# Patient Record
Sex: Female | Born: 1980 | Race: White | Hispanic: No | Marital: Married | Smoking: Never smoker
Health system: Southern US, Community
[De-identification: ages and names within clinical notes are randomized; demographics above are authoritative.]

## PROBLEM LIST (undated history)

## (undated) DIAGNOSIS — Z9889 Other specified postprocedural states: Secondary | ICD-10-CM

## (undated) DIAGNOSIS — T783XXA Angioneurotic edema, initial encounter: Secondary | ICD-10-CM

## (undated) DIAGNOSIS — F419 Anxiety disorder, unspecified: Secondary | ICD-10-CM

## (undated) HISTORY — DX: Angioneurotic edema, initial encounter: T78.3XXA

## (undated) HISTORY — PX: APPENDECTOMY: SHX54

## (undated) HISTORY — DX: Anxiety disorder, unspecified: F41.9

---
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
## 2003-07-13 HISTORY — PX: BREAST SURGERY: SHX581

## 2006-07-12 HISTORY — PX: CERVICAL FUSION: SHX112

## 2008-07-12 HISTORY — PX: CERVICAL FUSION: SHX112

## 2017-03-18 ENCOUNTER — Ambulatory Visit (INDEPENDENT_AMBULATORY_CARE_PROVIDER_SITE_OTHER): Payer: BLUE CROSS/BLUE SHIELD | Admitting: Allergy

## 2017-03-18 ENCOUNTER — Encounter: Payer: Self-pay | Admitting: Allergy

## 2017-03-18 VITALS — BP 100/64 | HR 82 | Temp 98.3°F | Resp 14 | Ht 69.0 in | Wt 133.8 lb

## 2017-03-18 DIAGNOSIS — T783XXD Angioneurotic edema, subsequent encounter: Secondary | ICD-10-CM | POA: Diagnosis not present

## 2017-03-18 MED ORDER — EPINEPHRINE 0.3 MG/0.3ML IJ SOAJ: 0.3000 mg | Freq: Once | INTRAMUSCULAR | 2 refills | Status: AC

## 2017-03-18 NOTE — Patient Instructions (Addendum)
Lip swelling      - 3 episodes of lip swelling without an identifiable trigger.  Concerned for possible hereditary angioedema as lip swelling is not accompanied with hives or rash.  Also concerned possible alpha-gal as lip swelling occurred after hamburger ingestion.  Not concerned about the salmon, asparagus or potatoes as you have had these foods again on separate occasions without symptoms.     - will obtain the following labs: HAE, alpha-gal panel, tryptase, environmental panel.     - at this time recommend you take Loratidine 10mg  and Ranitidine (Zantac) 150mg  daily to help decrease change of swelling reoccurring.       - recommend you have access to an epinephrine device Audry Riles(AuviQ) in case you develop more serious reactions in future.  Follow emergency action plan.    Follow-up 6 months or sooner if needed

## 2017-03-18 NOTE — Progress Notes (Signed)
New Patient Note  RE: Marilyn Leonard MRN: [id number] DOB: April 20, 1981 Date of Office Visit: 03/18/2017  Referring provider: No ref. provider found Primary care provider: Patient, No Pcp Per  Chief Complaint: lip swelling  History of present illness: Marilyn Leonard is a 36 y.o. female presenting today for evaluation of lip swelling.    She states 10 years ago she noticed when she would take OTC decongestants she would have lip swelling. She thus avoids OTC decongestants.   She is most concerned as she has had 3 episodes of lip swelling this year.  She denies any other symptoms including hives/rash, GI, CV or respiratory symptoms.  She has not had any other body parts swell besides the lips.   She has tried to identify if there are any triggering events/foods but she has not been able to find any consistent trigger.  She states one episode of lip swelling she ate salmon, aparagus, potatoes for dinner.  She has since ate these foods on separate occasions without any symptoms.  Another episode she recalls having hamburger and she developed lip swelling later in the night.  [facility] benadryl, loratidine and prednisone.  This was about 1.5 weeks ago.  She reports with medication it took 5 days for the lip swelling to resolve. She has a brother who is allergic to PCN and reports he would get ear swelling but this is the only family member that has had swelling issues.   She reports since moving here her neighbor smokes cigars and her husband has been partaking in cigar smoking.  She is unsure if she is having issues related to the smoke.   She denies any tick bites.     She reports she has never had seasonal allergies or food allergy.    She moved here from [place] in April.     When she was 36yo she was stung by a stinging insect on leg and had swelling at this site.   Review of systems: Review of Systems  Constitutional: Negative for chills, fever and malaise/fatigue.    HENT: Negative for congestion, ear discharge, ear pain, nosebleeds, sinus pain, sore throat and tinnitus.   Eyes: Negative for pain, discharge and redness.  Respiratory: Negative for cough, shortness of breath and wheezing.   Cardiovascular: Negative for chest pain.  Gastrointestinal: Negative for abdominal pain, constipation, diarrhea, heartburn, nausea and vomiting.  Musculoskeletal: Negative for joint pain.  Skin: Negative for itching and rash.  Neurological: Negative for dizziness and headaches.    All other systems negative unless noted above in HPI  Past medical history: Past Medical History:  Diagnosis Date  . Angio-edema     Past surgical history: Past Surgical History:  Procedure Laterality Date  . CERVICAL FUSION  2008  . CERVICAL FUSION  2010    Family history:  Family History  Problem Relation Age of Onset  . Eczema Sister   . Food Allergy Sister   . Food Allergy Brother   . Allergic rhinitis Brother   . Angioedema Neg Hx   . Asthma Neg Hx   . Urticaria Neg Hx   . Immunodeficiency Neg Hx     Social history: She lives in a home with carpeting with gas heating and central cooling. There are no pets in the home. There is no concern for water damage, mildew or roaches in the home. She is a Futures traderhomemaker. She has no smoking history but is cigar smoke.  Medication List: Allergies as of 03/18/2017      Reactions   Decongestant [oxymetazoline] Swelling   Any decongestant cause swelling      Medication List       Accurate as of 03/18/17  4:51 PM. Always use your most recent med list.          EPINEPHrine 0.3 mg/0.3 mL Soaj injection Commonly known as:  AUVI-Q Inject 0.3 mLs (0.3 mg total) into the muscle once.            Discharge Care Instructions        Start     Ordered   03/18/17 0000  Hereditary Angioedema     03/18/17 1013   03/18/17 0000  Alpha-Gal Panel     03/18/17 1013   03/18/17 0000  Tryptase     03/18/17 1013   03/18/17 0000   Allergens, Zone 3     03/18/17 1013   03/18/17 0000  EPINEPHrine (AUVI-Q) 0.3 mg/0.3 mL IJ SOAJ injection   Once    Comments:  Phone: [phone]   03/18/17 1013      Known medication allergies: Allergies  Allergen Reactions  . Decongestant [Oxymetazoline] Swelling    Any decongestant cause swelling     Physical examination: Blood pressure 100/64, pulse 82, temperature 98.3 F (36.8 C), temperature source Oral, resp. rate 14, height  (1.753 m), weight 133 lb 12.8 oz (60.7 kg), SpO2 97 %.  General: Alert, interactive, in no acute distress. HEENT:PERRLA, TMs pearly gray, turbinates non-edematous without discharge, post-pharynx non erythematous. No facial edema Neck: Supple without lymphadenopathy. Lungs: Clear to auscultation without wheezing, rhonchi or rales. {no increased work of breathing. CV: Normal S1, S2 without murmurs. Abdomen: Nondistended, nontender. Skin: Warm and dry, without lesions or rashes. Extremities:  No clubbing, cyanosis or edema. Neuro:   Grossly intact.  Diagnositics/Labs:  Allergy testing: Skin prick testing to tobacco is negative. Allergy testing results were read and interpreted by provider, documented by clinical staff.   Assessment and plan:   Angioedema      - 3 episodes of lip swelling without an identifiable trigger.  Concerned for possible hereditary angioedema as lip swelling is not accompanied with hives or rash and takes several days to fully resolve (if this is HAE antihistamines and steroids are not effective).  Also concerned possible alpha-gal as lip swelling occurred after hamburger ingestion.  Not concerned about salmon, asparagus or potatoes as you have had these foods again on separate occasions without symptoms.     - will obtain the following labs: HAE, alpha-gal panel, tryptase, environmental panel.     - at this time recommend you take Loratidine  and Ranitidine (Zantac)  daily to help decrease change of swelling  reoccurring Until labs return    - recommend you have access to an epinephrine device Audry Riles) in case you develop more serious reactions in future.  Follow emergency action plan.    Follow-up 6 months or sooner if needed   I appreciate the opportunity to take part in Ghina's care. Please do not hesitate to contact me with questions.  Sincerely,   Margo Aye, MD Allergy/Immunology [facility]

## 2017-03-23 LAB — ALLERGENS, ZONE 3
Bermuda Grass IgE: <0.1 kU/L
Cockroach, American IgE: <0.1 kU/L
Common Silver Birch IgE: <0.1 kU/L
D Farinae IgE: <0.1 kU/L
D Pteronyssinus IgE: <0.1 kU/L
Dog Dander IgE: <0.1 kU/L
Maple/Box Elder IgE: <0.1 kU/L
Nettle IgE: <0.1 kU/L
Oak, White IgE: <0.1 kU/L

## 2017-03-23 LAB — ALPHA-GAL PANEL
Alpha Gal IgE*: <0.1 kU/L (ref <0.35)
Beef (Bos spp) IgE: <0.1 kU/L (ref <0.35)
Class Interpretation: 0
Class Interpretation: 0
LAMB CLASS INTERPRETATION: 0

## 2017-03-23 LAB — HAE INTERPRETATION:

## 2017-03-23 LAB — HEREDITARY ANGIOEDEMA
C1 ESTERASE INHIBITOR, SERUM: 25 mg/dL (ref 21–39)
Complement C4, Serum: 20 mg/dL (ref 14–44)

## 2017-03-23 LAB — TRYPTASE: Tryptase: 2.5 ug/L (ref 2.2–13.2)

## 2017-03-23 LAB — C1 ESTERASE INHIBITOR, FUNC: C1 EST.INHIB.FUNCT.: 82 %{normal}

## 2017-03-29 LAB — COMPLEMENT COMPONENT C1Q: COMPLEMENT C1Q: 14 mg/dL (ref 11.8–24.4)

## 2017-03-29 LAB — SPECIMEN STATUS REPORT

## 2019-08-13 ENCOUNTER — Other Ambulatory Visit: Payer: Self-pay | Admitting: Obstetrics and Gynecology

## 2019-08-13 DIAGNOSIS — N6320 Unspecified lump in the left breast, unspecified quadrant: Secondary | ICD-10-CM

## 2019-08-29 ENCOUNTER — Ambulatory Visit
Admission: RE | Admit: 2019-08-29 | Discharge: 2019-08-29 | Disposition: A | Discharge status: Home / Self Care | Payer: Self-pay | Source: Ambulatory Visit | Attending: Obstetrics and Gynecology | Admitting: Obstetrics and Gynecology

## 2019-08-29 ENCOUNTER — Other Ambulatory Visit: Payer: Self-pay

## 2019-08-29 ENCOUNTER — Ambulatory Visit
Admission: RE | Admit: 2019-08-29 | Discharge: 2019-08-29 | Disposition: A | Discharge status: Home / Self Care | Payer: BLUE CROSS/BLUE SHIELD | Source: Ambulatory Visit | Attending: Obstetrics and Gynecology | Admitting: Obstetrics and Gynecology

## 2019-08-29 ENCOUNTER — Other Ambulatory Visit: Payer: Self-pay | Admitting: Obstetrics and Gynecology

## 2019-08-29 ENCOUNTER — Ambulatory Visit
Admission: RE | Admit: 2019-08-29 | Discharge: 2019-08-29 | Disposition: A | Discharge status: Home / Self Care | Payer: PRIVATE HEALTH INSURANCE | Source: Ambulatory Visit | Attending: Obstetrics and Gynecology | Admitting: Obstetrics and Gynecology

## 2019-08-29 DIAGNOSIS — R928 Other abnormal and inconclusive findings on diagnostic imaging of breast: Secondary | ICD-10-CM

## 2019-08-29 DIAGNOSIS — N6320 Unspecified lump in the left breast, unspecified quadrant: Secondary | ICD-10-CM

## 2019-09-17 ENCOUNTER — Ambulatory Visit (INDEPENDENT_AMBULATORY_CARE_PROVIDER_SITE_OTHER): Payer: 59 | Admitting: Family Medicine

## 2019-09-17 ENCOUNTER — Other Ambulatory Visit: Payer: Self-pay

## 2019-09-17 ENCOUNTER — Encounter: Payer: Self-pay | Admitting: Family Medicine

## 2019-09-17 VITALS — BP 112/64 | HR 98 | Temp 98.5°F | Resp 14 | Ht 68.5 in | Wt 149.0 lb

## 2019-09-17 DIAGNOSIS — Z0000 Encounter for general adult medical examination without abnormal findings: Secondary | ICD-10-CM

## 2019-09-17 DIAGNOSIS — Z7689 Persons encountering health services in other specified circumstances: Secondary | ICD-10-CM

## 2019-09-17 DIAGNOSIS — F418 Other specified anxiety disorders: Secondary | ICD-10-CM

## 2019-09-17 DIAGNOSIS — Z13220 Encounter for screening for lipoid disorders: Secondary | ICD-10-CM | POA: Diagnosis not present

## 2019-09-17 MED ORDER — LORAZEPAM 0.5 MG PO TABS: 0.5000 mg | ORAL_TABLET | Freq: Two times a day (BID) | ORAL | 0 refills | Status: DC | PRN

## 2019-09-17 MED ORDER — ESCITALOPRAM OXALATE 10 MG PO TABS: 10.0000 mg | ORAL_TABLET | Freq: Every day | ORAL | 3 refills | Status: DC

## 2019-09-17 NOTE — Patient Instructions (Signed)
COVID Vaccination Information As of right now, we will not be giving COVID-19 vaccines here in our office. It is too many storage and administrating regulations that our office is not equipped to provide at this time.    You can go online at [email or web address]   That website will give you information on all counties.    If not here are the numbers you can call. [facility] - [phone] [facility] - [phone] or [phone] [place] - [phone] opt.2 [facility] - [phone]   You can also find information on [email or web address] or call the state's COVID-19 information phone number at 211.   

## 2019-09-17 NOTE — Progress Notes (Signed)
Subjective:    Patient ID: Marilyn Leonard, female    DOB: 13-Jun-1981, 39 y.o.   MRN: [id number]  HPI Patient is a very pleasant 39 year old Caucasian female here today to establish care.  Past medical history is significant for a neck injury that occurred in the early 2000's.  Shelving fell on her while she was at work injuring her neck.  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  She still has a third bulging disc that occasionally gives her numbness and weakness in her hands and occasional muscle spasms but she is elected to just live with it.  She was previously taking lorazepam for muscle spasms in her neck but since moving to [place] from [place] 2 years ago she has not required any medication.  Overall she is doing well.  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  Despite seeing an allergist, they were never able to determine the specific cause.  She has only had 2 episodes.  She treats this with antihistamines.  Family history is significant for a father with aortic stenosis status post aortic valve replacement, coronary artery disease, chronic kidney disease, and type II AV block.  Mom has a history of hypertension.  Mother also had breast cancer at age 8.  Patient is already had her mammogram this year which was normal.  She sees gynecology who performs her Pap smears.  She denies any specific medical concerns other than anxiety.  When she was in hospital she took Lexapro.  She was able to manage successfully.  However over the last year she has been under increasing stress.  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  Despite doing this she is also having to homeschool her children due to the COVID-19 pandemic.  Her husband is away from home quite often with work.  Therefore the majority of the responsibility falls on her shoulders.  As result she feels generally anxious and unable to relax most days.  She suffered  panic attacks in her 87s.  She denies any panic attacks now however she does report generalized anxiety that is difficult to control.  She denies any depression or suicidal ideation or insomnia. Past Medical History:  Diagnosis Date  . Angio-edema   . Anxiety    Past Surgical History:  Procedure Laterality Date  . APPENDECTOMY    . BREAST SURGERY  2005   L Breast Lumpectomy- benign  . CERVICAL FUSION  2008  . CERVICAL FUSION  2010  . ROTATOR CUFF REPAIR Right 1996   No current outpatient medications on file prior to visit.   No current facility-administered medications on file prior to visit.   Allergies  Allergen Reactions  . Decongestant [Oxymetazoline] Swelling    Any decongestant cause swelling   Social History   Socioeconomic History  . Marital status: Married    Spouse name: Not on file  . Number of children: 2  . Years of education: Not on file  . Highest education level: Not on file  Occupational History  . Occupation: Accounting  Tobacco Use  . Smoking status: Never Smoker  . Smokeless tobacco: Never Used  Substance and Sexual Activity  . Alcohol use: Not Currently  . Drug use: Never  . Sexual activity: Yes  Other Topics Concern  . Not on file  Social History Narrative  . Not on file   Social Determinants of Health  Financial Resource Strain:   . Difficulty of Paying Living Expenses: Not on file  Food Insecurity:   . Worried About Programme researcher, broadcasting/film/video in the Last Year: Not on file  . Ran Out of Food in the Last Year: Not on file  Transportation Needs:   . Lack of Transportation (Medical): Not on file  . Lack of Transportation (Non-Medical): Not on file  Physical Activity:   . Days of Exercise per Week: Not on file  . Minutes of Exercise per Session: Not on file  Stress:   . Feeling of Stress : Not on file  Social Connections:   . Frequency of Communication with Friends and Family: Not on file  . Frequency of Social Gatherings with Friends and  Family: Not on file  . Attends Religious Services: Not on file  . Active Member of Clubs or Organizations: Not on file  . Attends Banker Meetings: Not on file  . Marital Status: Not on file  Intimate Partner Violence:   . Fear of Current or Ex-Partner: Not on file  . Emotionally Abused: Not on file  . Physically Abused: Not on file  . Sexually Abused: Not on file   Family History  Problem Relation Age of Onset  . Eczema Sister   . Food Allergy Sister   . Food Allergy Brother   . Allergic rhinitis Brother   . Breast cancer Mother 55  . Heart disease Father   . Hypertension Father   . Kidney disease Father   . Angioedema Neg Hx   . Asthma Neg Hx   . Urticaria Neg Hx   . Immunodeficiency Neg Hx       Review of Systems  All other systems reviewed and are negative.      Objective:   Physical Exam Vitals reviewed.  Constitutional:      General: She is not in acute distress.    Appearance: Normal appearance. She is normal weight. She is not ill-appearing, toxic-appearing or diaphoretic.  HENT:     Head: Normocephalic and atraumatic.     Right Ear: Tympanic membrane, ear canal and external ear normal.     Left Ear: Tympanic membrane, ear canal and external ear normal.     Nose: Nose normal. No congestion or rhinorrhea.     Mouth/Throat:     Mouth: Mucous membranes are moist.     Pharynx: Oropharynx is clear. No oropharyngeal exudate or posterior oropharyngeal erythema.  Eyes:     General: No scleral icterus.       Right eye: No discharge.        Left eye: No discharge.     Extraocular Movements: Extraocular movements intact.     Conjunctiva/sclera: Conjunctivae normal.     Pupils: Pupils are equal, round, and reactive to light.  Neck:     Vascular: No carotid bruit.  Cardiovascular:     Rate and Rhythm: Normal rate and regular rhythm.     Pulses: Normal pulses.     Heart sounds: Normal heart sounds. No murmur. No friction rub. No gallop.   Pulmonary:      Effort: Pulmonary effort is normal. No respiratory distress.     Breath sounds: Normal breath sounds. No stridor. No wheezing, rhonchi or rales.  Chest:     Chest wall: No tenderness.  Abdominal:     General: Abdomen is flat. Bowel sounds are normal. There is no distension.     Palpations: Abdomen is soft. There is  no mass.     Tenderness: There is no abdominal tenderness. There is no right CVA tenderness, left CVA tenderness, guarding or rebound.     Hernia: No hernia is present.  Musculoskeletal:        General: No swelling, tenderness or deformity.     Cervical back: Normal range of motion and neck supple. No rigidity or tenderness.     Right lower leg: No edema.     Left lower leg: No edema.  Lymphadenopathy:     Cervical: No cervical adenopathy.  Skin:    General: Skin is warm.     Coloration: Skin is not jaundiced or pale.     Findings: No bruising, erythema, lesion or rash.  Neurological:     General: No focal deficit present.     Mental Status: She is alert and oriented to person, place, and time. Mental status is at baseline.     Cranial Nerves: No cranial nerve deficit.     Sensory: No sensory deficit.     Motor: No weakness.     Coordination: Coordination normal.     Gait: Gait normal.     Deep Tendon Reflexes: Reflexes normal.  Psychiatric:        Mood and Affect: Mood normal.        Behavior: Behavior normal.        Thought Content: Thought content normal.        Judgment: Judgment normal.           Assessment & Plan:  Establishing care with new doctor, encounter for - Plan: CBC with Differential/Platelet, COMPLETE METABOLIC PANEL WITH GFR, Lipid panel  Screening cholesterol level - Plan: CBC with Differential/Platelet, COMPLETE METABOLIC PANEL WITH GFR, Lipid panel  Situational anxiety  Patient's physical exam is completely normal.  I would like her to return fasting at her earliest convenience for CBC, CMP, and a fasting lipid panel.  We discussed  the flu shot today and the patient politely declined this.  She is due for Covid vaccination and I gave her the information to schedule for this at her earliest convenience.  Mammogram is up-to-date.  Pap smear is up-to-date.  She does not require colonoscopy until age 40.  She is interested in trying back Lexapro 10 mg a day for generalized anxiety disorder.  I will give her lorazepam that she can use sparingly for breakthrough anxiety.  She anticipates that she will use this very infrequently.  Reassess in 4 to 5 weeks to see if the Lexapro is helping and if so she can certainly continue that medication long-term for as long as she feels that she benefits from it.

## 2019-10-09 ENCOUNTER — Other Ambulatory Visit: Payer: Self-pay | Admitting: Family Medicine

## 2019-11-28 ENCOUNTER — Telehealth: Payer: Self-pay | Admitting: Family Medicine

## 2019-11-28 NOTE — Telephone Encounter (Signed)
Pt called after hours line  States she has congestion in her chest for 2 weeks now,  feels like previous bronchitis  has coughing fits when she eats so has not had anything really solid in past 2 days, but drinking fine  Occ wheeze Taking allergy meds, does not tolerate OTC cough.congestion meds Has history of bronchitis per pt   Had COVID 19 vaccines already No fever, chest pain, sinus drainage  She is okay with telehealth or in office visit for Thursday. Advised may need to see Crystal if you are full, she is okay with that

## 2019-11-29 ENCOUNTER — Ambulatory Visit (INDEPENDENT_AMBULATORY_CARE_PROVIDER_SITE_OTHER): Payer: 59 | Admitting: Family Medicine

## 2019-11-29 ENCOUNTER — Other Ambulatory Visit: Payer: Self-pay

## 2019-11-29 ENCOUNTER — Encounter: Payer: Self-pay | Admitting: Family Medicine

## 2019-11-29 ENCOUNTER — Ambulatory Visit
Admission: RE | Admit: 2019-11-29 | Discharge: 2019-11-29 | Disposition: A | Discharge status: Home / Self Care | Payer: 59 | Source: Ambulatory Visit | Attending: Family Medicine | Admitting: Family Medicine

## 2019-11-29 VITALS — BP 100/62 | HR 74 | Temp 97.7°F | Resp 14 | Ht 68.5 in | Wt 150.0 lb

## 2019-11-29 DIAGNOSIS — J40 Bronchitis, not specified as acute or chronic: Secondary | ICD-10-CM | POA: Diagnosis not present

## 2019-11-29 MED ORDER — PREDNISONE 20 MG PO TABS: ORAL_TABLET | ORAL | 0 refills | Status: DC

## 2019-11-29 MED ORDER — ALBUTEROL SULFATE HFA 108 (90 BASE) MCG/ACT IN AERS: 2.0000 | INHALATION_SPRAY | Freq: Four times a day (QID) | RESPIRATORY_TRACT | 1 refills | Status: DC | PRN

## 2019-11-29 MED ORDER — AZITHROMYCIN 250 MG PO TABS: ORAL_TABLET | ORAL | 0 refills | Status: DC

## 2019-11-29 NOTE — Progress Notes (Signed)
Subjective:    Patient ID: Marilyn Leonard, female    DOB: [birth year], 39 y.o.   MRN: [id number]  HPI  Patient is a very pleasant 39 year old Caucasian female who presents today with shortness of breath.  3 weeks ago, the patient had her first Pfizer Covid vaccine.  Shortly thereafter she developed body aches, fever, and a cough.  Cough is now productive with yellow and brown mucus.  She also reports wheezing and rhonchorous breath sounds.  She reports shortness of breath with minimal activity.  On examination today she has markedly diminished breath sounds in all 4 lung quadrants.  She has expiratory wheezing.  She has rhonchorous breath sounds.  She has bronchospasms and is also short of breath simply in talking. Past Medical History:  Diagnosis Date  . Angio-edema   . Anxiety    Past Surgical History:  Procedure Laterality Date  . APPENDECTOMY    . BREAST SURGERY  2005   L Breast Lumpectomy- benign  . CERVICAL FUSION  2008  . CERVICAL FUSION  2010  . ROTATOR CUFF REPAIR Right 1996   Current Outpatient Medications on File Prior to Visit  Medication Sig Dispense Refill  . escitalopram (LEXAPRO) 10 MG tablet TAKE 1 TABLET BY MOUTH EVERY DAY, INSURANCE NOT CONTRACTED WITH [facility] 90 tablet 2  . LORazepam (ATIVAN) 0.5 MG tablet Take 1 tablet (0.5 mg total) by mouth 2 (two) times daily as needed for anxiety. 30 tablet 0   No current facility-administered medications on file prior to visit.   Allergies  Allergen Reactions  . Decongestant [Oxymetazoline] Swelling    Any decongestant cause swelling   Social History   Socioeconomic History  . Marital status: Married    Spouse name: Not on file  . Number of children: 2  . Years of education: Not on file  . Highest education level: Not on file  Occupational History  . Occupation: Accounting  Tobacco Use  . Smoking status: Never Smoker  . Smokeless tobacco: Never Used  Substance and Sexual Activity  . Alcohol use: Not Currently  .  Drug use: Never  . Sexual activity: Yes  Other Topics Concern  . Not on file  Social History Narrative  . Not on file   Social Determinants of Health   Financial Resource Strain:   . Difficulty of Paying Living Expenses:   Food Insecurity:   . Worried About Programme researcher, broadcasting/film/video in the Last Year:   . Barista in the Last Year:   Transportation Needs:   . Freight forwarder (Medical):   Marland Kitchen Lack of Transportation (Non-Medical):   Physical Activity:   . Days of Exercise per Week:   . Minutes of Exercise per Session:   Stress:   . Feeling of Stress :   Social Connections:   . Frequency of Communication with Friends and Family:   . Frequency of Social Gatherings with Friends and Family:   . Attends Religious Services:   . Active Member of Clubs or Organizations:   . Attends Banker Meetings:   Marland Kitchen Marital Status:   Intimate Partner Violence:   . Fear of Current or Ex-Partner:   . Emotionally Abused:   Marland Kitchen Physically Abused:   . Sexually Abused:       Review of Systems  All other systems reviewed and are negative.      Objective:   Physical Exam Vitals reviewed.  Constitutional:      General: She is  not in acute distress.    Appearance: Normal appearance. She is not ill-appearing or toxic-appearing.  HENT:     Right Ear: Tympanic membrane and ear canal normal.     Left Ear: Tympanic membrane and ear canal normal.     Nose: Nose normal. No congestion or rhinorrhea.     Mouth/Throat:     Pharynx: No oropharyngeal exudate or posterior oropharyngeal erythema.  Cardiovascular:     Rate and Rhythm: Normal rate and regular rhythm.  Pulmonary:     Effort: No respiratory distress.     Breath sounds: Decreased air movement present. Wheezing and rhonchi present.  Neurological:     Mental Status: She is alert.           Assessment & Plan:  Bronchitis with wheezing - Plan: predniSONE (DELTASONE) 20 MG tablet, albuterol (VENTOLIN HFA) 108 (90 Base)  MCG/ACT inhaler, azithromycin (ZITHROMAX) 250 MG tablet, DG Chest 2 View  Patient was given 2.5 mg of albuterol in a nebulizer.  The wheezing did improve on her exam although she still seems very congested.  Also the albuterol made her very jittery and anxious.  She does have a history of panic attacks.  In the past she has tried albuterol and had similar reactions to it.  I did give her an albuterol inhaler.  I encouraged her to use 2 puffs every 6 hours as needed for wheezing and shortness of breath.  If it does make her feel anxious, she could take a half of an Ativan to help relax however she appears to be having bronchospasms and I believe would benefit from albuterol.  I will also start her on a prednisone taper pack given the level of wheezing and a decreased breath sounds throughout.  Begin a Z-Pak for bronchitis given the fact she is having purulent sputum that ha lasted now for 3 weeks.  Obtain a chest x-ray to rule out pneumonia.

## 2019-11-29 NOTE — Telephone Encounter (Signed)
I tried calling patient to see if she would like to schedule an appointment but mailbox is full.

## 2019-12-14 ENCOUNTER — Other Ambulatory Visit: Payer: Self-pay | Admitting: Family Medicine

## 2019-12-14 ENCOUNTER — Telehealth: Payer: Self-pay | Admitting: Family Medicine

## 2019-12-14 MED ORDER — HYDROCODONE-HOMATROPINE 5-1.5 MG/5ML PO SYRP: 5.0000 mL | ORAL_SOLUTION | Freq: Three times a day (TID) | ORAL | 0 refills | Status: DC | PRN

## 2019-12-14 NOTE — Telephone Encounter (Signed)
I will send hycodan for cough.  Letter is in Epic.

## 2019-12-14 NOTE — Telephone Encounter (Signed)
Patient called in stating that she has completed the Zpak, and Prednisone taper and she is still coughing. She would like to know if you could call in something for cough. Also she would like to know if you can write a note for her to take to her [facility] since she has not been able to go for the past 3 weeks due to cough so that she could get reimbursed for the missed time. Please advise?

## 2019-12-14 NOTE — Telephone Encounter (Signed)
Left message return call

## 2019-12-17 ENCOUNTER — Telehealth: Payer: Self-pay

## 2019-12-17 MED ORDER — GUAIFENESIN-CODEINE 100-10 MG/5ML PO SOLN: 5.0000 mL | Freq: Four times a day (QID) | ORAL | 0 refills | Status: DC | PRN

## 2019-12-17 NOTE — Telephone Encounter (Signed)
rOBITUSSIN CODIENE CALLED Pt seen for bronchitis

## 2019-12-17 NOTE — Telephone Encounter (Signed)
Pt has been called, to pick up gym note, not available to leave a voice mail, also she would like cough medicine.

## 2019-12-18 NOTE — Telephone Encounter (Signed)
Pt aware note is ready to be picked up

## 2020-06-24 ENCOUNTER — Other Ambulatory Visit: Payer: Self-pay

## 2020-06-24 MED ORDER — ESCITALOPRAM OXALATE 10 MG PO TABS: ORAL_TABLET | ORAL | 2 refills | Status: DC

## 2021-02-28 ENCOUNTER — Other Ambulatory Visit: Payer: Self-pay | Admitting: Family Medicine

## 2021-09-07 IMAGING — MG DIGITAL DIAGNOSTIC BILAT W/ TOMO W/ CAD
8 of 14 series · 8 of 40 positions shown · non-contrast
Comparison: None

CLINICAL DATA: Patient complains of focal left breast pain.
Patient's mother was diagnosed with breast cancer at the age of 45.

EXAM:
DIGITAL DIAGNOSTIC BILATERAL MAMMOGRAM WITH CAD AND TOMO
ULTRASOUND BILATERAL BREAST

[R CC synth-2D]
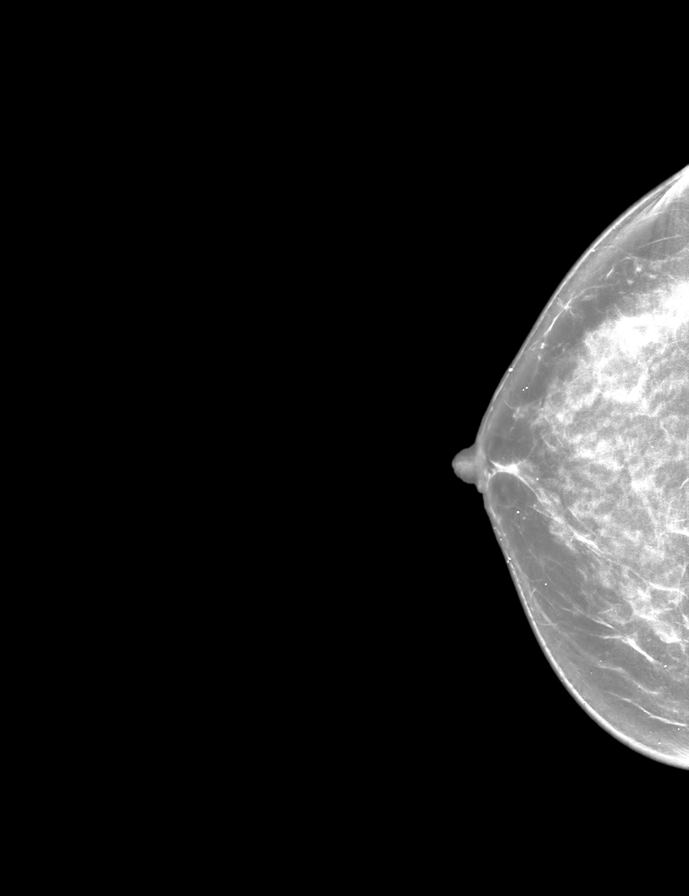

[L CC synth-2D]
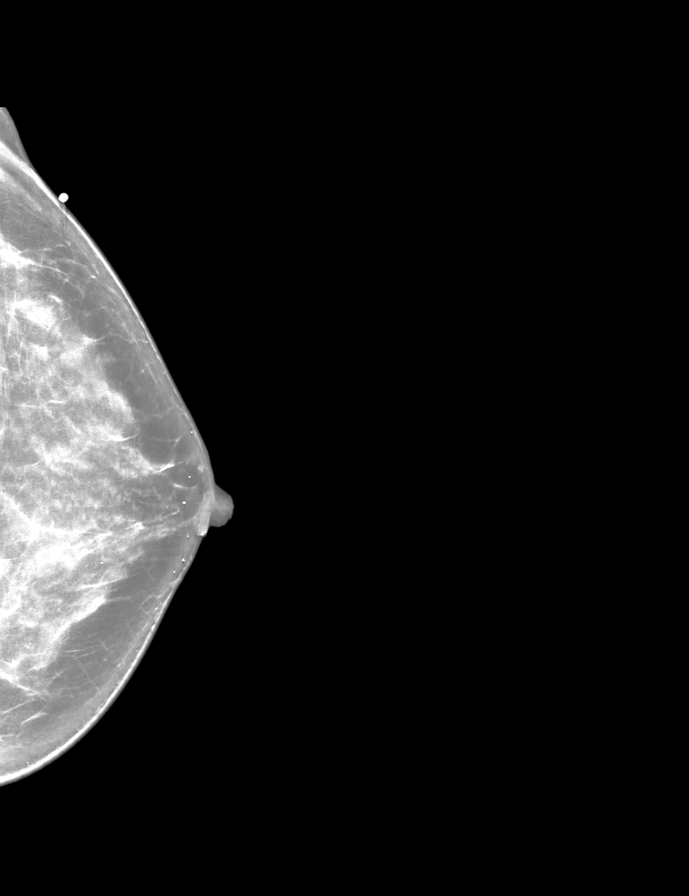

[R MLO synth-2D (1 of 2)]
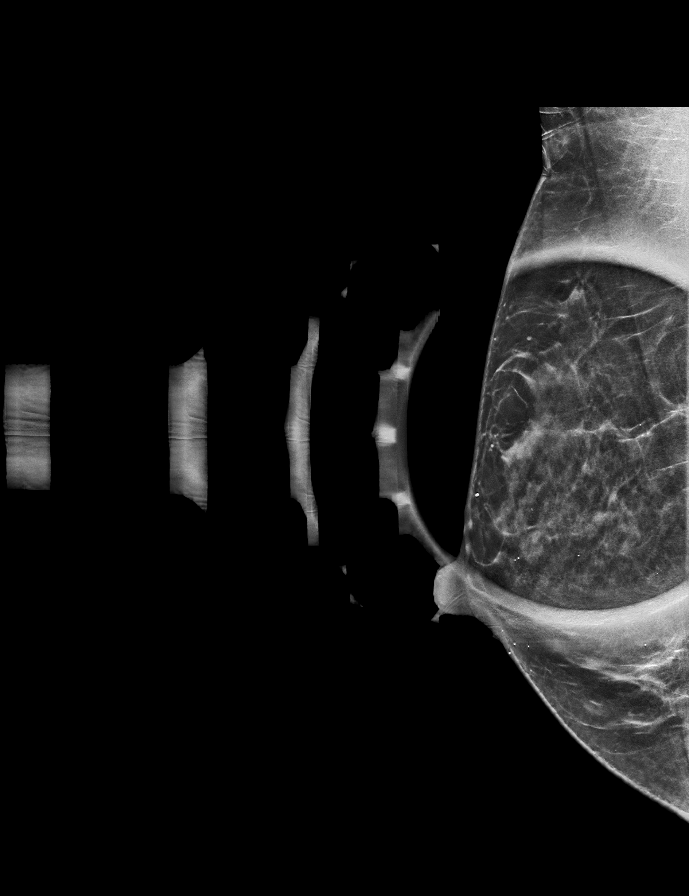

[L TAN synth-2D]
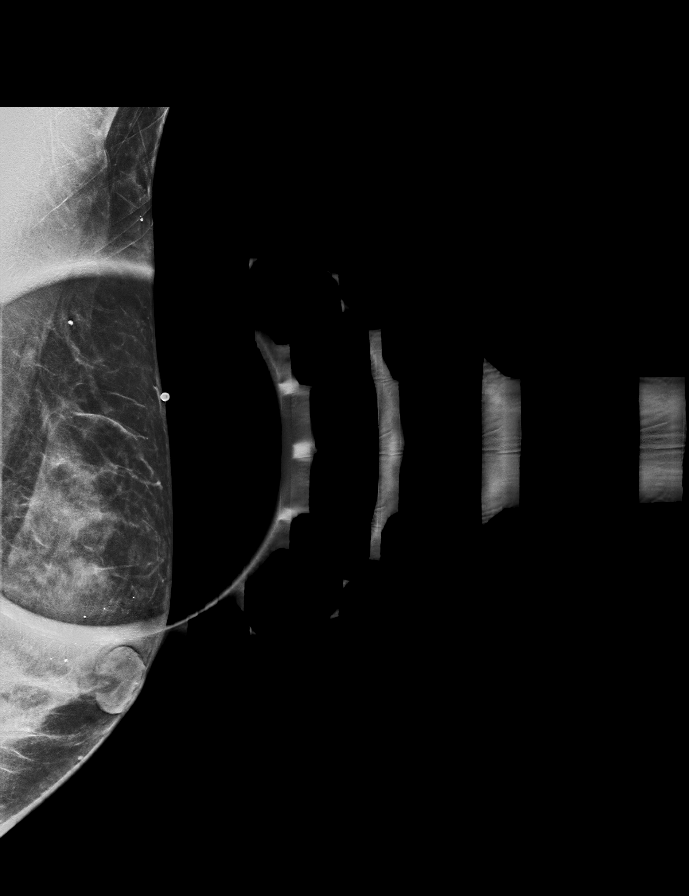

[R MLO synth-2D (2 of 2)]
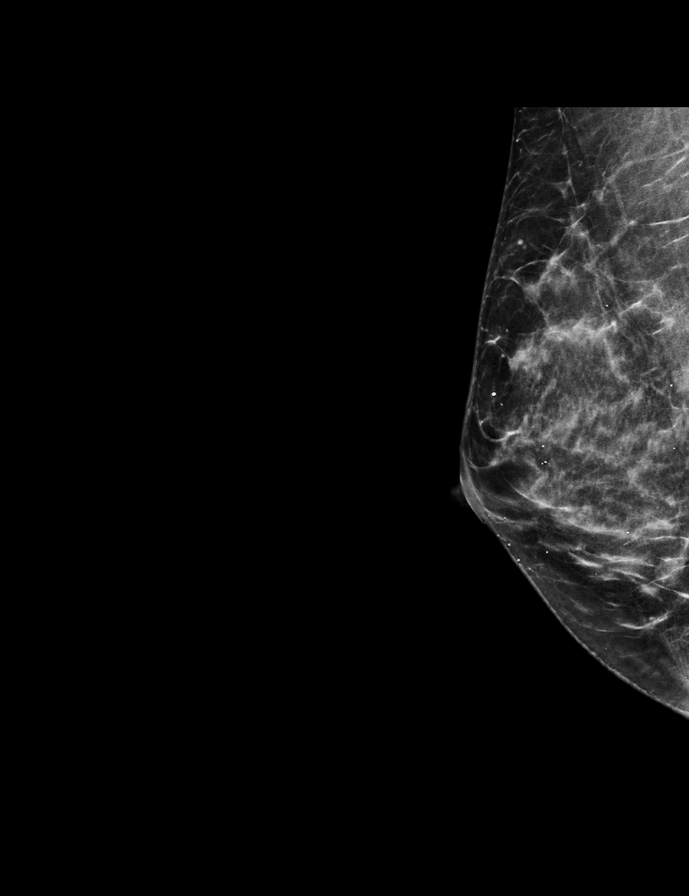

[R ML synth-2D]
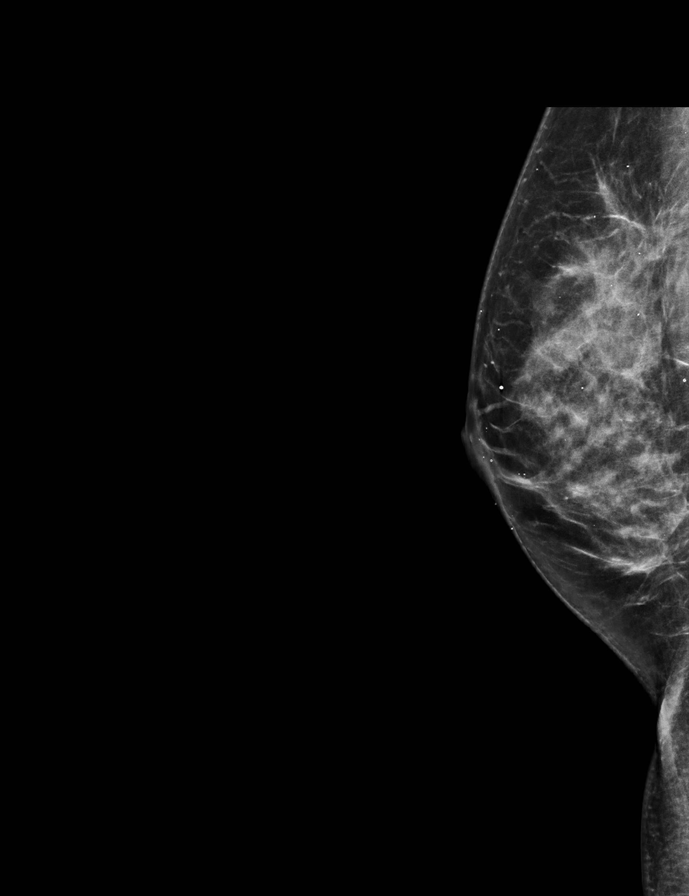

[L MLO synth-2D]
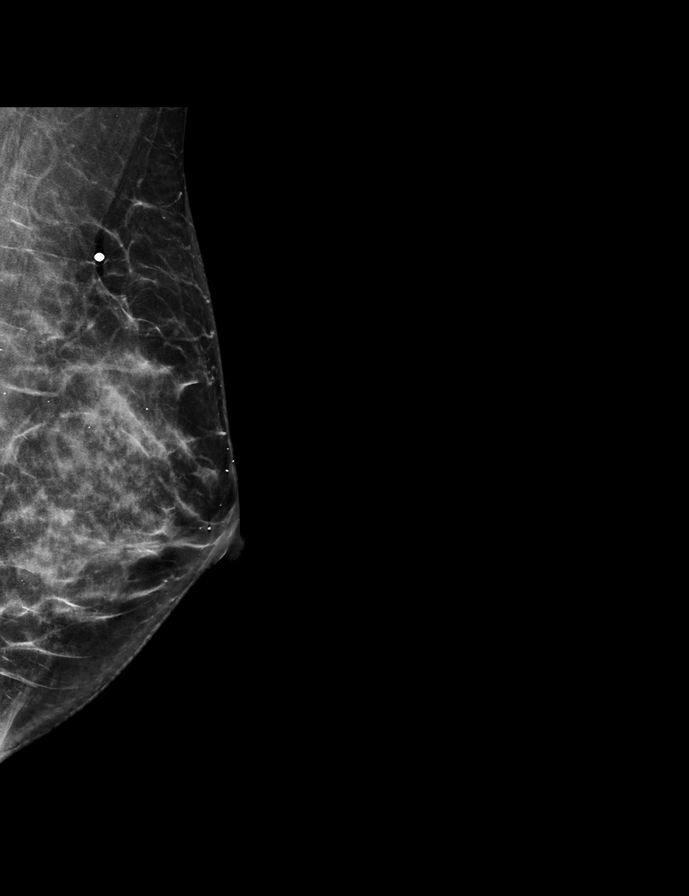

[L TAN tomo · tomo slice 27/54.0]
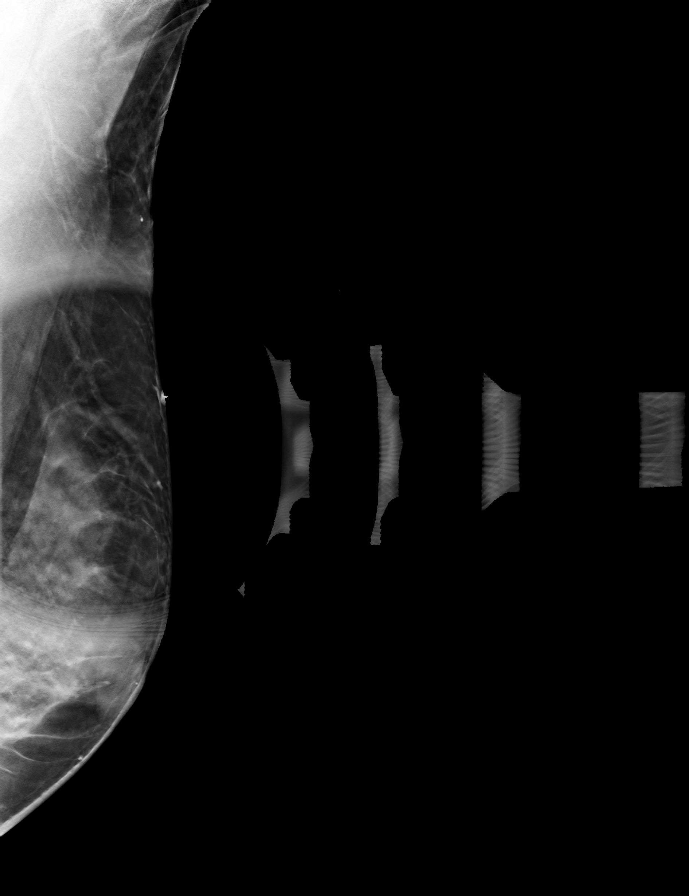

[8 of 40 positions shown; findings below may reference images not displayed]

ACR Breast Density Category c: The breast tissue is heterogeneously
dense, which may obscure small masses.
FINDINGS: Asymmetric fibroglandular tissue was seen in the superior aspect of
the right breast. Additional imaging through this area shows normal
fibroglandular tissue. No suspicious mass or malignant type
microcalcifications identified in either breast.

Mammographic images were processed with CAD.

Targeted ultrasound is performed, showing normal tissue in the
upper-outer quadrant of the right breast. Normal fibroglandular
tissue is seen in the left breast at 1 o'clock 7 cm from the nipple
with the patient complains of focal pain.
IMPRESSION: No evidence of malignancy in either breast.

RECOMMENDATION:
Bilateral screening mammogram in 1 year is recommended.

The patient's mother was diagnosed with breast cancer at the age of
45. The American Cancer Society recommends annual MRI and
mammography in patients with an estimated lifetime risk of
developing breast cancer greater than 20 - 25%, or who are known or
suspected to be positive for the breast cancer gene.

I have discussed the findings and recommendations with the patient.
If applicable, a reminder letter will be sent to the patient
regarding the next appointment.

BI-RADS CATEGORY  1: Negative.

## 2021-09-07 IMAGING — US US BREAST*R* LIMITED INC AXILLA
1 series · 3 of 3 positions shown · non-contrast
Comparison: None

CLINICAL DATA: Patient complains of focal left breast pain.
Patient's mother was diagnosed with breast cancer at the age of 45.

EXAM:
DIGITAL DIAGNOSTIC BILATERAL MAMMOGRAM WITH CAD AND TOMO
ULTRASOUND BILATERAL BREAST

[Series 1: us breast*right* limited inc axilla · 0.06mm/px · 3 of 3 slices shown]
[im 1/3]
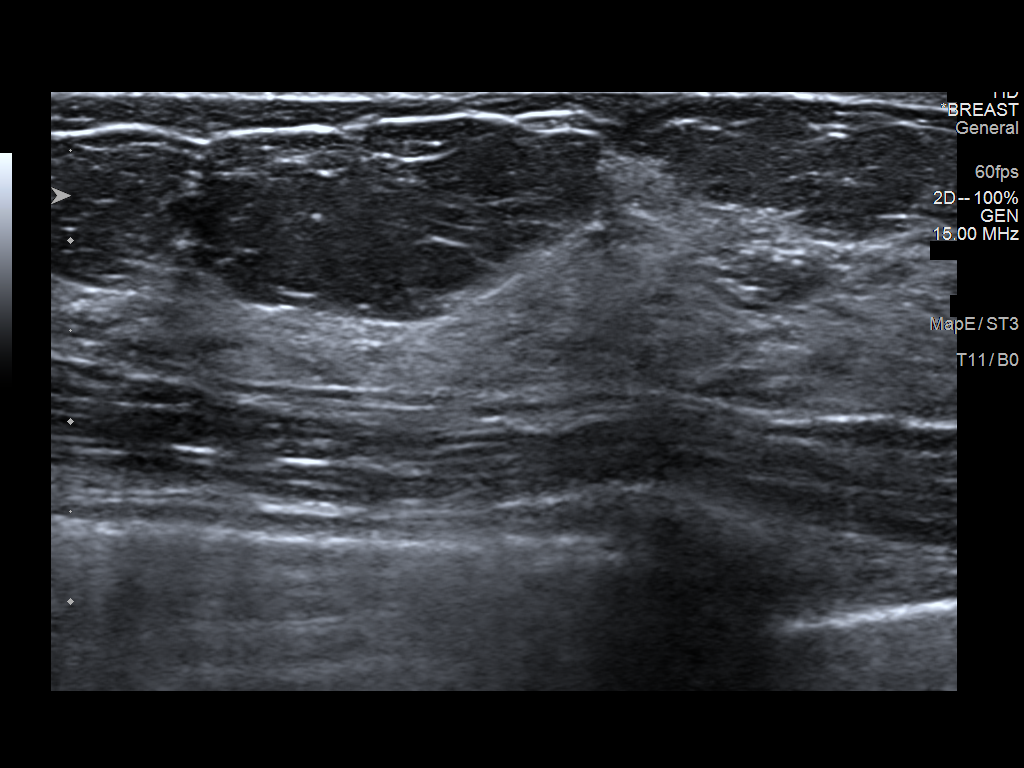
[im 2/3]
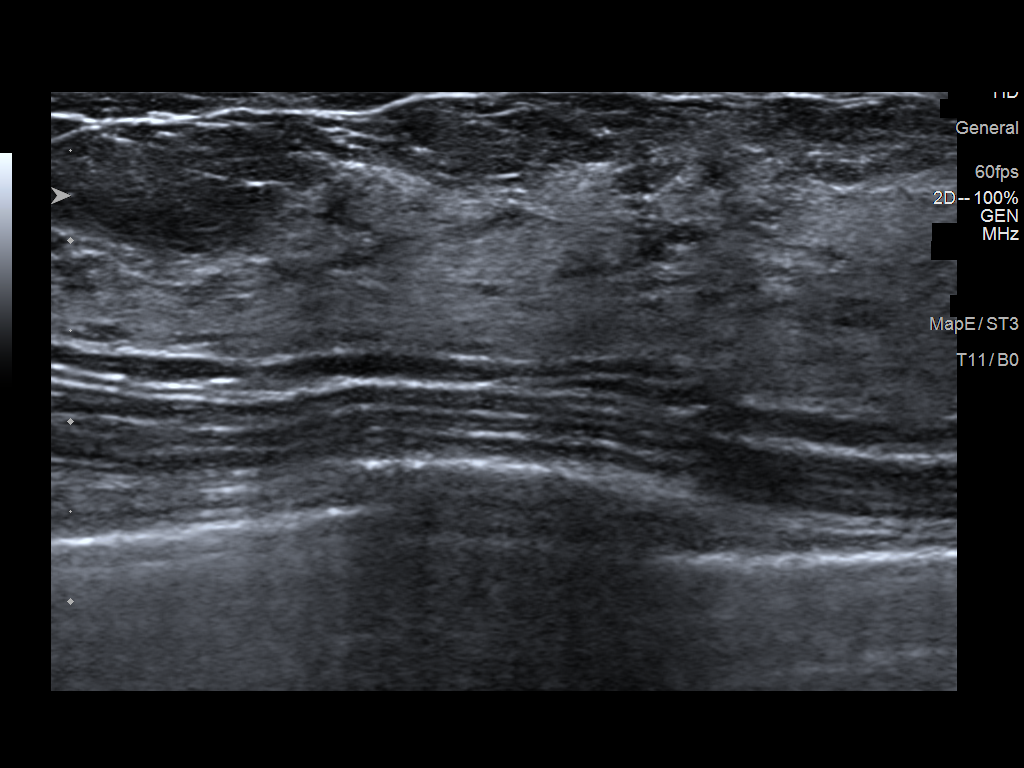
[im 3/3]
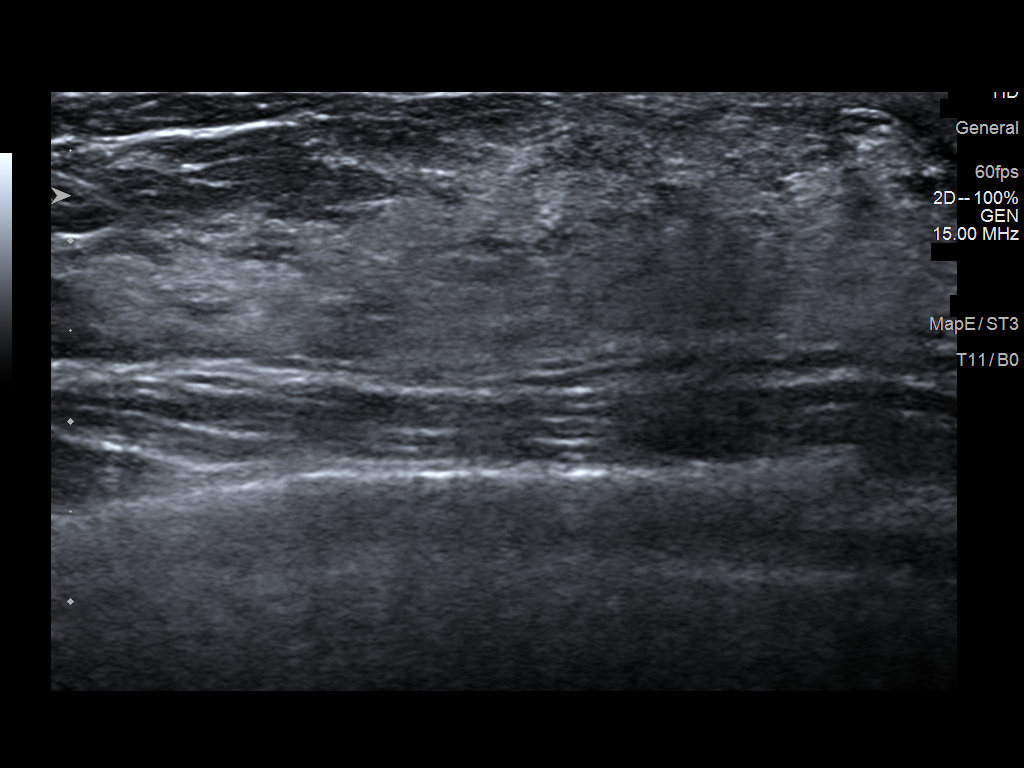

[3 of 3 positions shown; findings below may reference images not displayed]

ACR Breast Density Category c: The breast tissue is heterogeneously
dense, which may obscure small masses.
FINDINGS: Asymmetric fibroglandular tissue was seen in the superior aspect of
the right breast. Additional imaging through this area shows normal
fibroglandular tissue. No suspicious mass or malignant type
microcalcifications identified in either breast.

Mammographic images were processed with CAD.

Targeted ultrasound is performed, showing normal tissue in the
upper-outer quadrant of the right breast. Normal fibroglandular
tissue is seen in the left breast at 1 o'clock 7 cm from the nipple
with the patient complains of focal pain.
IMPRESSION: No evidence of malignancy in either breast.

RECOMMENDATION:
Bilateral screening mammogram in 1 year is recommended.

The patient's mother was diagnosed with breast cancer at the age of
45. The American Cancer Society recommends annual MRI and
mammography in patients with an estimated lifetime risk of
developing breast cancer greater than 20 - 25%, or who are known or
suspected to be positive for the breast cancer gene.

I have discussed the findings and recommendations with the patient.
If applicable, a reminder letter will be sent to the patient
regarding the next appointment.

BI-RADS CATEGORY  1: Negative.

## 2021-12-08 IMAGING — CR DG CHEST 2V
2 series · 2 of 2 positions shown · non-contrast
Comparison: None.

CLINICAL DATA: Wheezing

EXAM:
CHEST - 2 VIEW

[w chest pa]
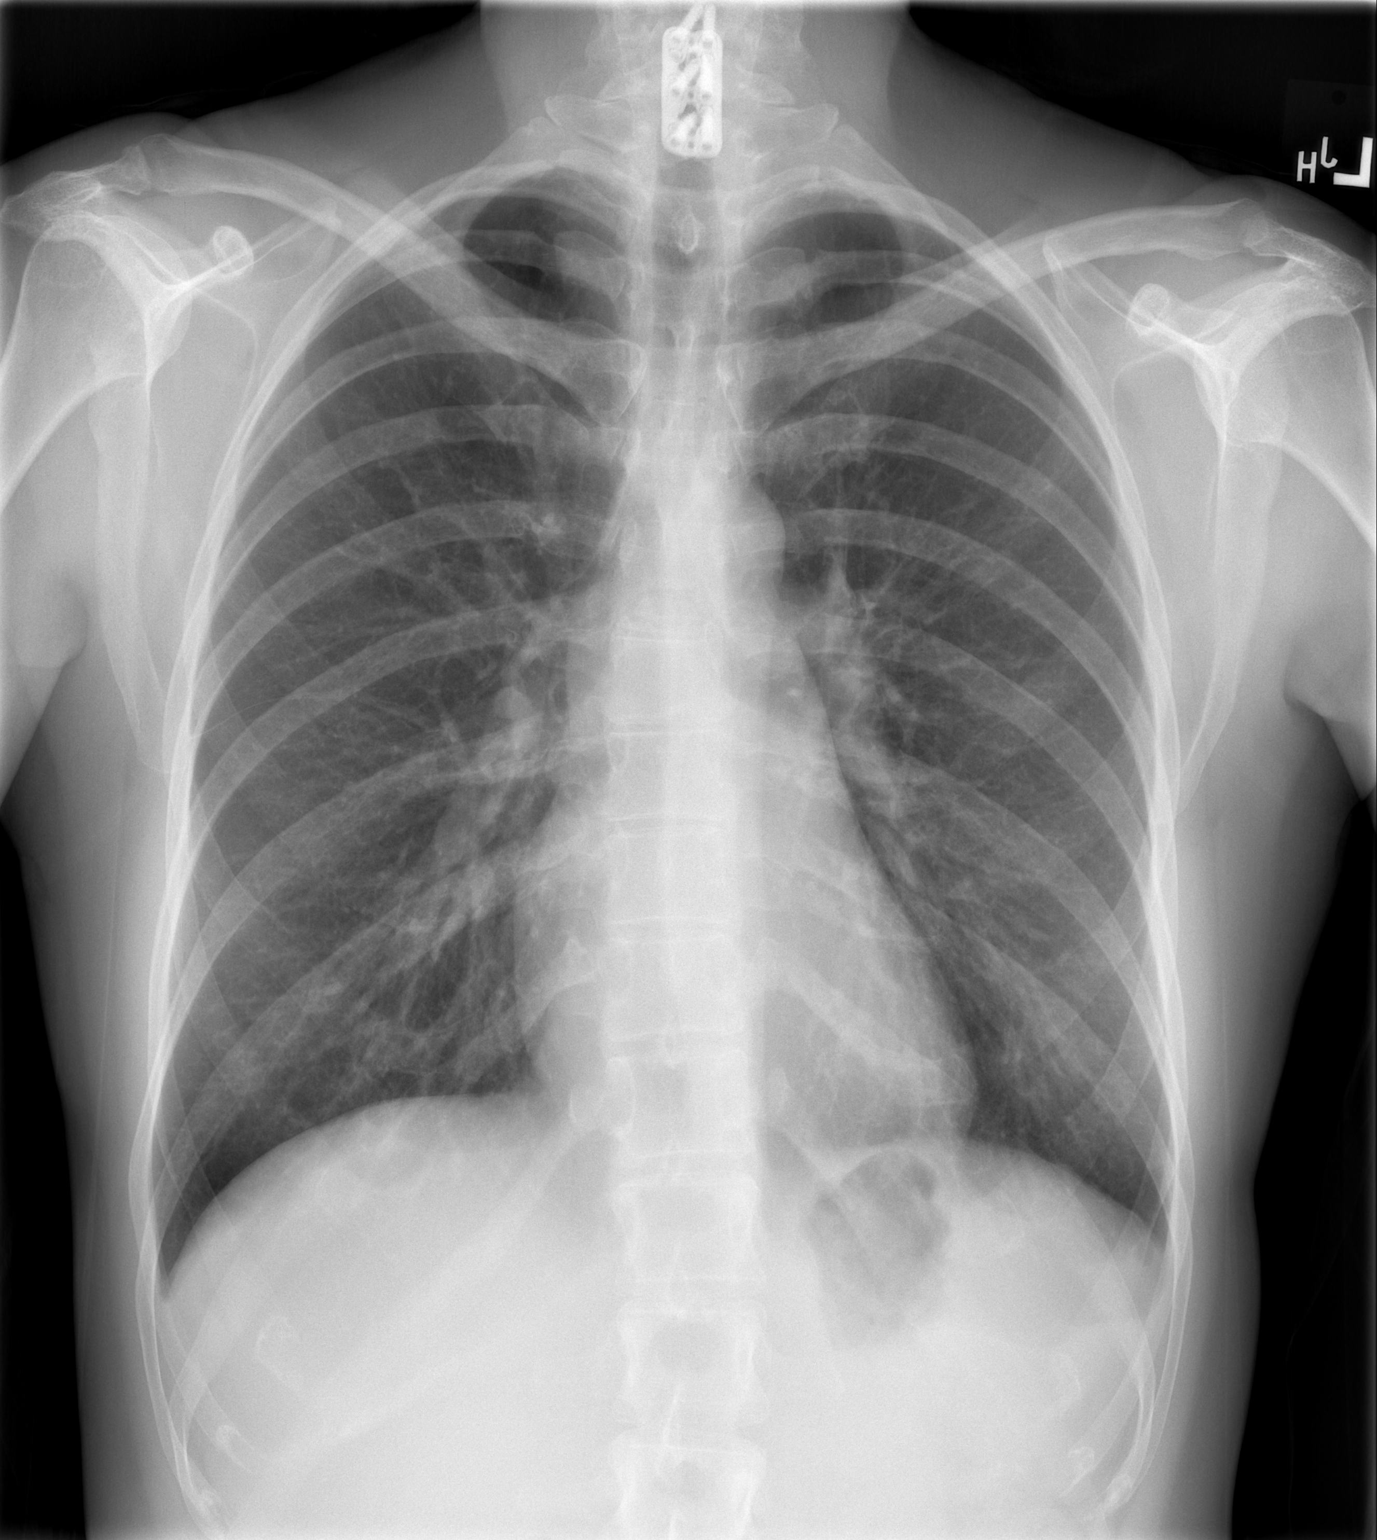

[w chest lat]
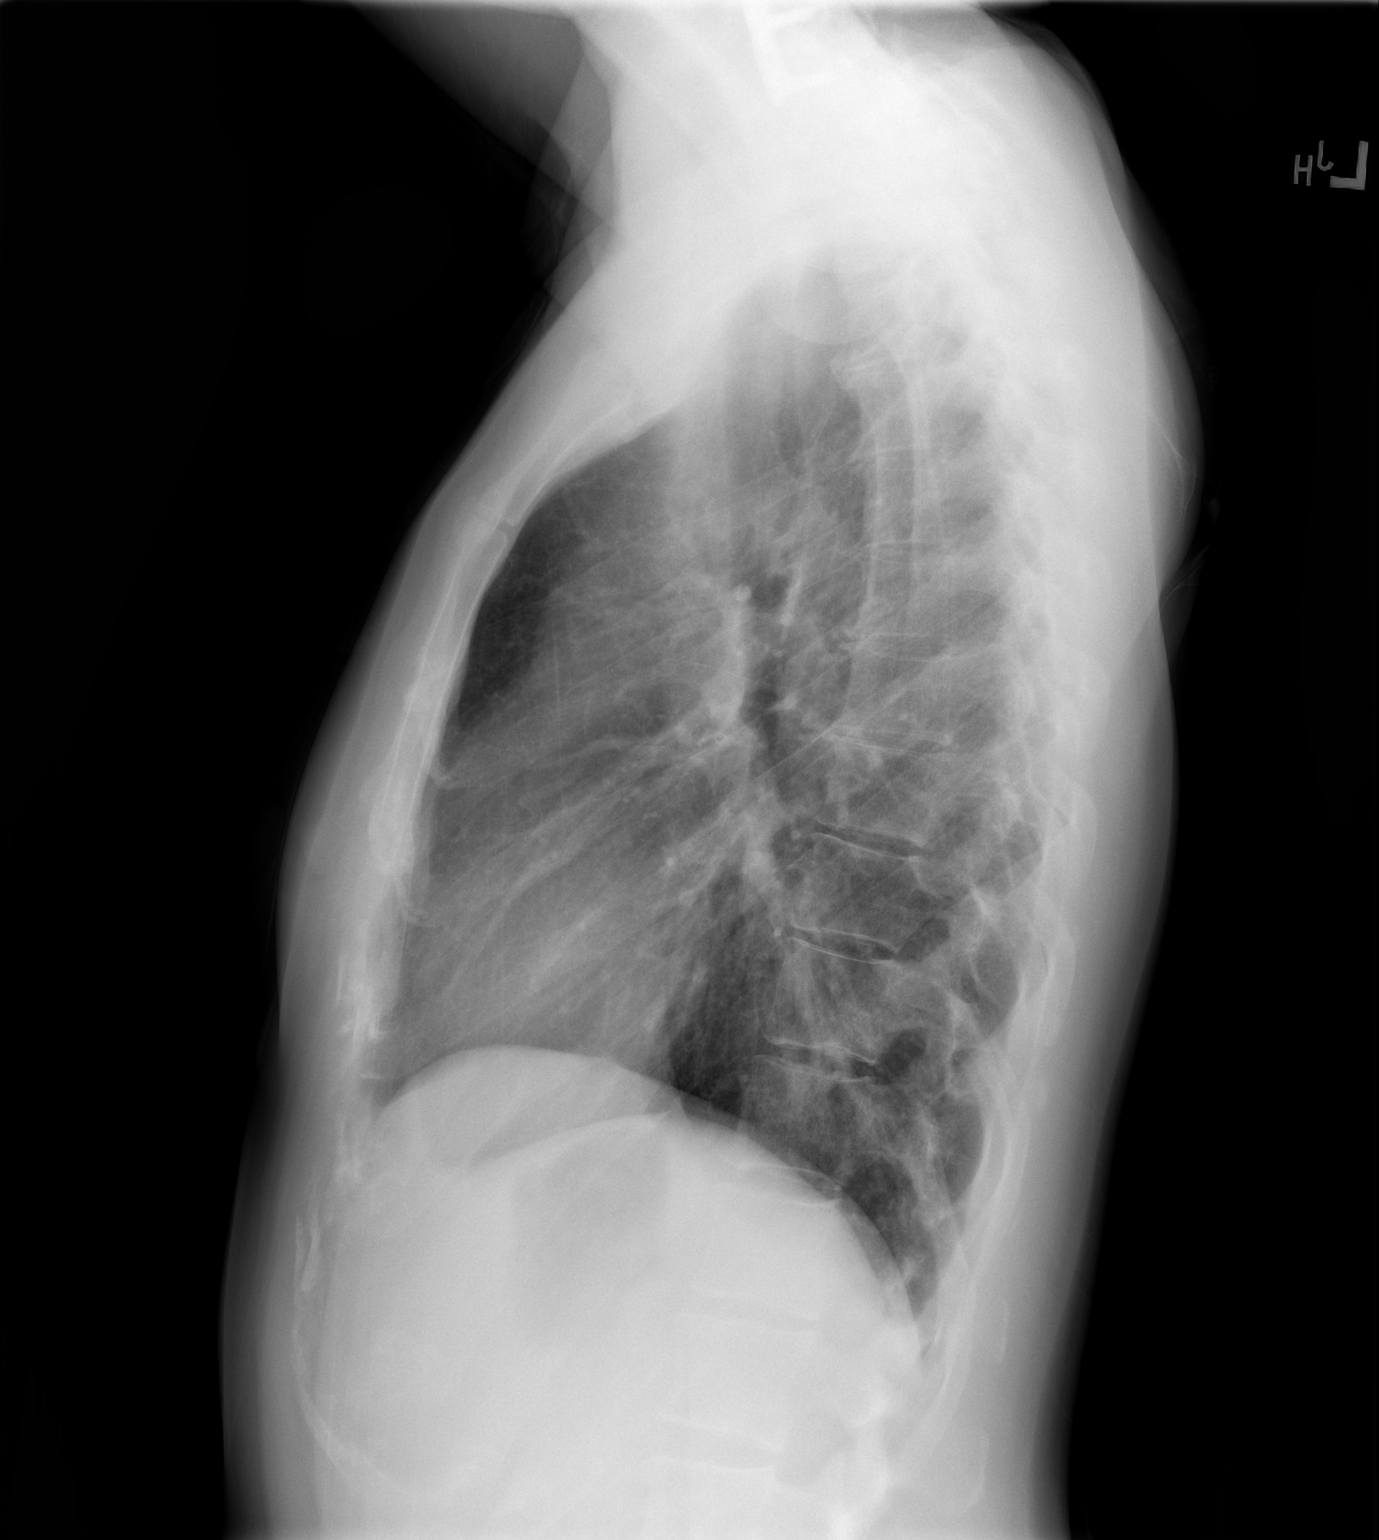

[2 of 2 positions shown; findings below may reference images not displayed]

FINDINGS: The heart size and mediastinal contours are within normal limits.
Both lungs are clear. The visualized skeletal structures are
unremarkable.
IMPRESSION: No active cardiopulmonary disease.

## 2021-12-28 ENCOUNTER — Other Ambulatory Visit: Payer: Self-pay | Admitting: Family Medicine

## 2021-12-28 DIAGNOSIS — R350 Frequency of micturition: Secondary | ICD-10-CM | POA: Diagnosis not present

## 2021-12-28 DIAGNOSIS — N390 Urinary tract infection, site not specified: Secondary | ICD-10-CM | POA: Diagnosis not present

## 2021-12-28 DIAGNOSIS — R1031 Right lower quadrant pain: Secondary | ICD-10-CM | POA: Diagnosis not present

## 2022-04-05 DIAGNOSIS — Z1231 Encounter for screening mammogram for malignant neoplasm of breast: Secondary | ICD-10-CM | POA: Diagnosis not present

## 2022-04-07 ENCOUNTER — Other Ambulatory Visit: Payer: Self-pay | Admitting: Obstetrics and Gynecology

## 2022-04-07 DIAGNOSIS — R928 Other abnormal and inconclusive findings on diagnostic imaging of breast: Secondary | ICD-10-CM

## 2022-04-15 ENCOUNTER — Ambulatory Visit
Admission: RE | Admit: 2022-04-15 | Discharge: 2022-04-15 | Disposition: A | Discharge status: Home / Self Care | Payer: 59 | Source: Ambulatory Visit | Attending: Obstetrics and Gynecology | Admitting: Obstetrics and Gynecology

## 2022-04-15 ENCOUNTER — Other Ambulatory Visit: Payer: 59

## 2022-04-15 ENCOUNTER — Ambulatory Visit
Admission: RE | Admit: 2022-04-15 | Discharge: 2022-04-15 | Disposition: A | Discharge status: Home / Self Care | Payer: BC Managed Care – PPO | Source: Ambulatory Visit | Attending: Obstetrics and Gynecology | Admitting: Obstetrics and Gynecology

## 2022-04-15 DIAGNOSIS — N6011 Diffuse cystic mastopathy of right breast: Secondary | ICD-10-CM | POA: Diagnosis not present

## 2022-04-15 DIAGNOSIS — R928 Other abnormal and inconclusive findings on diagnostic imaging of breast: Secondary | ICD-10-CM

## 2022-04-15 DIAGNOSIS — R92331 Mammographic heterogeneous density, right breast: Secondary | ICD-10-CM | POA: Diagnosis not present

## 2022-04-19 DIAGNOSIS — Z124 Encounter for screening for malignant neoplasm of cervix: Secondary | ICD-10-CM | POA: Diagnosis not present

## 2022-04-19 DIAGNOSIS — R350 Frequency of micturition: Secondary | ICD-10-CM | POA: Diagnosis not present

## 2022-04-19 DIAGNOSIS — Z01419 Encounter for gynecological examination (general) (routine) without abnormal findings: Secondary | ICD-10-CM | POA: Diagnosis not present

## 2022-04-19 DIAGNOSIS — Z113 Encounter for screening for infections with a predominantly sexual mode of transmission: Secondary | ICD-10-CM | POA: Diagnosis not present

## 2022-09-20 ENCOUNTER — Telehealth: Payer: Self-pay | Admitting: Family Medicine

## 2022-09-20 MED ORDER — ESCITALOPRAM OXALATE 10 MG PO TABS: ORAL_TABLET | ORAL | 2 refills | Status: DC

## 2022-09-20 NOTE — Telephone Encounter (Signed)
*  STAT* If patient is at the pharmacy, call can be transferred to refill team.   1. Which medications need to be refilled? (please list name of each medication and dose if known) escitalopram (LEXAPRO) 10 MG tablet   2. Which pharmacy/location (including street and city if local pharmacy) is medication to be sent to?[facility] in [place]   3. Do they need a 30 day or 90 day supply? 90 day

## 2022-11-01 ENCOUNTER — Other Ambulatory Visit: Payer: 59

## 2022-11-01 DIAGNOSIS — R5383 Other fatigue: Secondary | ICD-10-CM

## 2022-11-01 DIAGNOSIS — Z13220 Encounter for screening for lipoid disorders: Secondary | ICD-10-CM

## 2022-11-01 LAB — CBC WITH DIFFERENTIAL/PLATELET
Absolute Monocytes: 543 cells/uL (ref 200–950)
RBC: 4.33 10*6/uL (ref 3.80–5.10)
RDW: 12 % (ref 11.0–15.0)
Total Lymphocyte: 27.7 %

## 2022-11-02 LAB — COMPLETE METABOLIC PANEL WITH GFR
AG Ratio: 1.6 (calc) (ref 1.0–2.5)
ALT: 23 U/L (ref 6–29)
AST: 13 U/L (ref 10–30)
Albumin: 4.3 g/dL (ref 3.6–5.1)
Alkaline phosphatase (APISO): 63 U/L (ref 31–125)
BUN: 21 mg/dL (ref 7–25)
CO2: 27 mmol/L (ref 20–32)
Calcium: 9.3 mg/dL (ref 8.6–10.2)
Chloride: 105 mmol/L (ref 98–110)
Creat: 0.84 mg/dL (ref 0.50–0.99)
Globulin: 2.7 g/dL (calc) (ref 1.9–3.7)
Glucose, Bld: 87 mg/dL (ref 65–99)
Potassium: 4.3 mmol/L (ref 3.5–5.3)
Sodium: 137 mmol/L (ref 135–146)
Total Bilirubin: 0.4 mg/dL (ref 0.2–1.2)
Total Protein: 7 g/dL (ref 6.1–8.1)
eGFR: 88 mL/min/{1.73_m2} (ref >=60)

## 2022-11-02 LAB — LIPID PANEL
Cholesterol: 158 mg/dL (ref <200)
HDL: 60 mg/dL (ref >=50)
LDL Cholesterol (Calc): 84 mg/dL (calc)
Non-HDL Cholesterol (Calc): 98 mg/dL (calc) (ref <130)
Total CHOL/HDL Ratio: 2.6 (calc) (ref <5.0)
Triglycerides: 64 mg/dL (ref <150)

## 2022-11-02 LAB — CBC WITH DIFFERENTIAL/PLATELET
Basophils Absolute: 41 cells/uL (ref 0–200)
Basophils Relative: 0.7 %
Eosinophils Absolute: 271 cells/uL (ref 15–500)
Eosinophils Relative: 4.6 %
HCT: 39.4 % (ref 35.0–45.0)
Hemoglobin: 13 g/dL (ref 11.7–15.5)
Lymphs Abs: 1634 cells/uL (ref 850–3900)
MCH: 30 pg (ref 27.0–33.0)
MCHC: 33 g/dL (ref 32.0–36.0)
MCV: 91 fL (ref 80.0–100.0)
MPV: 11 fL (ref 7.5–12.5)
Monocytes Relative: 9.2 %
Neutro Abs: 3410 cells/uL (ref 1500–7800)
Neutrophils Relative %: 57.8 %
Platelets: 233 10*3/uL (ref 140–400)
WBC: 5.9 10*3/uL (ref 3.8–10.8)

## 2022-11-04 ENCOUNTER — Encounter: Payer: Self-pay | Admitting: Family Medicine

## 2022-11-04 ENCOUNTER — Ambulatory Visit (INDEPENDENT_AMBULATORY_CARE_PROVIDER_SITE_OTHER): Payer: BC Managed Care – PPO | Admitting: Family Medicine

## 2022-11-04 VITALS — BP 124/76 | HR 79 | Temp 98.6°F | Ht 68.5 in | Wt 155.6 lb

## 2022-11-04 DIAGNOSIS — Z0000 Encounter for general adult medical examination without abnormal findings: Secondary | ICD-10-CM

## 2022-11-04 MED ORDER — ALPRAZOLAM 0.5 MG PO TABS: 0.5000 mg | ORAL_TABLET | Freq: Two times a day (BID) | ORAL | 0 refills | Status: AC | PRN

## 2022-11-04 NOTE — Progress Notes (Signed)
Subjective:    Patient ID: Marilyn Leonard, female    DOB: [birth year], 42 y.o.   MRN: [id number]  HPI Patient is a very pleasant 37 Caucasian female.  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  She also helps care for her children.  She has a tremendous amount of stress at home that occasionally makes her feel very anxious.  She is taking Lexapro 10 mg a day for this.  This helps take the edge off but at times she feels overwhelmed.  She would like something that she can use on a as needed basis.  She denies any depression.  She denies any suicidal thoughts.  She never used the original prescription for Xanax that I gave her.  It has since expired.  Her most recent lab work is outstanding.  Her immunizations are up-to-date Pap smear is performed by her gynecologist (Dr. Tenny Craw).  Mammogram was normal 10/23. Lab on 11/01/2022  Component Date Value Ref Range Status   WBC 11/01/2022 5.9  3.8 - 10.8 Thousand/uL Final   RBC 11/01/2022 4.33  3.80 - 5.10 Million/uL Final   Hemoglobin 11/01/2022 13.0  11.7 - 15.5 g/dL Final   HCT 40/98/1191 39.4  35.0 - 45.0 % Final   MCV 11/01/2022 91.0  80.0 - 100.0 fL Final   MCH 11/01/2022 30.0  27.0 - 33.0 pg Final   MCHC 11/01/2022 33.0  32.0 - 36.0 g/dL Final   RDW 47/82/9562 12.0  11.0 - 15.0 % Final   Platelets 11/01/2022 233  140 - 400 Thousand/uL Final   MPV 11/01/2022 11.0  7.5 - 12.5 fL Final   Neutro Abs 11/01/2022 3,410  1,500 - 7,800 cells/uL Final   Lymphs Abs 11/01/2022 1,634  850 - 3,900 cells/uL Final   Absolute Monocytes 11/01/2022 543  200 - 950 cells/uL Final   Eosinophils Absolute 11/01/2022 271  15 - 500 cells/uL Final   Basophils Absolute 11/01/2022 41  0 - 200 cells/uL Final   Neutrophils Relative % 11/01/2022 57.8  % Final   Total Lymphocyte 11/01/2022 27.7  % Final   Monocytes Relative 11/01/2022 9.2  % Final   Eosinophils Relative 11/01/2022 4.6  % Final   Basophils Relative 11/01/2022 0.7  % Final   Glucose, Bld 11/01/2022 87   65 - 99 mg/dL Final   Comment: .            Fasting reference interval .    BUN 11/01/2022 21  7 - 25 mg/dL Final   Creat 13/02/6577 0.84  0.50 - 0.99 mg/dL Final   eGFR 46/96/2952 88  > OR = 60 mL/min/1.22m2 Final   BUN/Creatinine Ratio 11/01/2022 SEE NOTE:  6 - 22 (calc) Final   Comment:    Not Reported: BUN and Creatinine are within    reference range. .    Sodium 11/01/2022 137  135 - 146 mmol/L Final   Potassium 11/01/2022 4.3  3.5 - 5.3 mmol/L Final   Chloride 11/01/2022 105  98 - 110 mmol/L Final   CO2 11/01/2022 27  20 - 32 mmol/L Final   Calcium 11/01/2022 9.3  8.6 - 10.2 mg/dL Final   Total Protein 84/13/2440 7.0  6.1 - 8.1 g/dL Final   Albumin 05/08/2535 4.3  3.6 - 5.1 g/dL Final   Globulin 64/40/3474 2.7  1.9 - 3.7 g/dL (calc) Final   AG Ratio 11/01/2022 1.6  1.0 - 2.5 (calc) Final   Total Bilirubin 11/01/2022 0.4  0.2 - 1.2 mg/dL Final  Alkaline phosphatase (APISO) 11/01/2022 63  31 - 125 U/L Final   AST 11/01/2022 13  10 - 30 U/L Final   ALT 11/01/2022 23  6 - 29 U/L Final   Cholesterol 11/01/2022 158  <200 mg/dL Final   HDL 16/04/9603 60  > OR = 50 mg/dL Final   Triglycerides 54/03/8118 64  <150 mg/dL Final   LDL Cholesterol (Calc) 11/01/2022 84  mg/dL (calc) Final   Comment: Reference range: <100 . Desirable range <100 mg/dL for primary prevention;   <70 mg/dL for patients with CHD or diabetic patients  with > or = 2 CHD risk factors. Marland Kitchen LDL-C is now calculated using the Martin-Hopkins  calculation, which is a validated novel method providing  better accuracy than the Friedewald equation in the  estimation of LDL-C.  Horald Pollen et al. Lenox Ahr. 1478;295(62): 2061-2068  (http://education.[facility].com/faq/FAQ164)    Total CHOL/HDL Ratio 11/01/2022 2.6  <1.3 (calc) Final   Non-HDL Cholesterol (Calc) 11/01/2022 98  <130 mg/dL (calc) Final   Comment: For patients with diabetes plus 1 major ASCVD risk  factor, treating to a non-HDL-C goal of <100 mg/dL   (LDL-C of <08 mg/dL) is considered a therapeutic  option.     Past Medical History:  Diagnosis Date   Angio-edema    Anxiety    Past Surgical History:  Procedure Laterality Date   APPENDECTOMY     BREAST SURGERY  2005   L Breast Lumpectomy- benign   CERVICAL FUSION  2008   CERVICAL FUSION  2010   ROTATOR CUFF REPAIR Right 1996   Current Outpatient Medications on File Prior to Visit  Medication Sig Dispense Refill   escitalopram (LEXAPRO) 10 MG tablet TAKE 1 TABLET BY MOUTH EVERY DAY 90 tablet 2   No current facility-administered medications on file prior to visit.   Allergies  Allergen Reactions   Decongestant [Oxymetazoline] Swelling    Any decongestant cause swelling   Social History   Socioeconomic History   Marital status: Married    Spouse name: Not on file   Number of children: 2   Years of education: Not on file   Highest education level: Not on file  Occupational History   Occupation: Accounting  Tobacco Use   Smoking status: Never   Smokeless tobacco: Never  Vaping Use   Vaping Use: Never used  Substance and Sexual Activity   Alcohol use: Not Currently   Drug use: Never   Sexual activity: Yes  Other Topics Concern   Not on file  Social History Narrative   Not on file   Social Determinants of Health   Financial Resource Strain: Not on file  Food Insecurity: Not on file  Transportation Needs: Not on file  Physical Activity: Not on file  Stress: Not on file  Social Connections: Not on file  Intimate Partner Violence: Not on file   Family History  Problem Relation Age of Onset   Eczema Sister    Food Allergy Sister    Food Allergy Brother    Allergic rhinitis Brother    Breast cancer Mother 9   Heart disease Father    Hypertension Father    Kidney disease Father    Angioedema Neg Hx    Asthma Neg Hx    Urticaria Neg Hx    Immunodeficiency Neg Hx      Review of Systems  All other systems reviewed and are negative.       Objective:   Physical Exam Vitals reviewed.  Constitutional:      General: She is not in acute distress.    Appearance: Normal appearance. She is normal weight. She is not ill-appearing, toxic-appearing or diaphoretic.  HENT:     Head: Normocephalic and atraumatic.     Right Ear: Tympanic membrane and ear canal normal.     Left Ear: Tympanic membrane and ear canal normal.     Nose: Nose normal. No congestion or rhinorrhea.     Mouth/Throat:     Mouth: Mucous membranes are moist.     Pharynx: Oropharynx is clear. No oropharyngeal exudate or posterior oropharyngeal erythema.  Eyes:     Conjunctiva/sclera: Conjunctivae normal.  Neck:     Vascular: No carotid bruit.  Cardiovascular:     Rate and Rhythm: Normal rate and regular rhythm.     Pulses: Normal pulses.     Heart sounds: Normal heart sounds. No murmur heard.    No friction rub. No gallop.  Pulmonary:     Effort: Pulmonary effort is normal. No respiratory distress.     Breath sounds: Normal breath sounds. No wheezing, rhonchi or rales.  Abdominal:     General: Abdomen is flat. Bowel sounds are normal. There is no distension.     Palpations: Abdomen is soft.     Tenderness: There is no abdominal tenderness. There is no guarding or rebound.  Musculoskeletal:     Cervical back: Normal range of motion and neck supple. No rigidity.     Right lower leg: No edema.     Left lower leg: No edema.  Lymphadenopathy:     Cervical: No cervical adenopathy.  Skin:    Coloration: Skin is not jaundiced.     Findings: No bruising, erythema, lesion or rash.  Neurological:     General: No focal deficit present.     Mental Status: She is alert and oriented to person, place, and time. Mental status is at baseline.     Cranial Nerves: No cranial nerve deficit.     Sensory: No sensory deficit.     Motor: No weakness.     Coordination: Coordination normal.     Gait: Gait normal.  Psychiatric:        Mood and Affect: Mood normal.         Behavior: Behavior normal.        Thought Content: Thought content normal.        Judgment: Judgment normal.         Assessment & Plan:  General medical exam - Plan: CBC with Differential/Platelet, COMPLETE METABOLIC PANEL WITH GFR, Lipid panel Patient's physical exam today is completely normal.  Her blood work is outstanding.  Immunizations are up-to-date.  Breast cancer screening and cervical cancer screening are up-to-date.  She is not yet due for colon cancer screening.  We discussed treatment for anxiety and the patient would like Xanax that she could use sparingly as needed for anxiety.  I will give her 0.5 mg tablets that she can take twice a day as needed.  The last prescription expired before she ever used it.  Therefore I have no concerns about habituation or dependency.  We also discussed possible hydroxyzine but she is concerned about the sedating nature

## 2022-12-23 ENCOUNTER — Encounter: Payer: Self-pay | Admitting: Family Medicine

## 2023-09-18 ENCOUNTER — Other Ambulatory Visit: Payer: Self-pay | Admitting: Family Medicine

## 2023-09-19 NOTE — Telephone Encounter (Signed)
 Requested medications are due for refill today.  yes  Requested medications are on the active medications list.  yes  Last refill. 09/20/2022 #90 2 rf  Future visit scheduled.   no  Notes to clinic.  Pt is more than 3 months overdue for an OV.    Requested Prescriptions  Pending Prescriptions Disp Refills   escitalopram (LEXAPRO) 10 MG tablet [Pharmacy Med Name: ESCITALOPRAM 10 MG TABLET] 90 tablet 2    Sig: TAKE 1 TABLET BY MOUTH EVERY DAY     Psychiatry:  Antidepressants - SSRI Failed - 09/19/2023  5:14 PM      Failed - Valid encounter within last 6 months    Recent Outpatient Visits           3 years ago Bronchitis with wheezing   [facility] Pickard, Priscille Heidelberg, MD   4 years ago Establishing care with new doctor, encounter for   [facility] Pickard, Priscille Heidelberg, MD

## 2023-11-03 ENCOUNTER — Other Ambulatory Visit: Payer: Self-pay | Admitting: Family Medicine

## 2023-11-04 ENCOUNTER — Encounter: Payer: Self-pay | Admitting: Family Medicine

## 2023-11-04 NOTE — Telephone Encounter (Signed)
 Requested medication (s) are due for refill today: yes  Requested medication (s) are on the active medication list: yes  Last refill:  09/20/22  Future visit scheduled: no  Notes to clinic:  pt is due for OV/CPE for refill. Please advise      Requested Prescriptions  Pending Prescriptions Disp Refills   escitalopram  (LEXAPRO ) 10 MG tablet [Pharmacy Med Name: ESCITALOPRAM  10 MG TABLET] 90 tablet 2    Sig: TAKE 1 TABLET BY MOUTH EVERY DAY     Psychiatry:  Antidepressants - SSRI Failed - 11/04/2023  1:26 PM      Failed - Valid encounter within last 6 months    Recent Outpatient Visits           1 year ago General medical exam   [facility] Pickard, Cisco Crest, MD

## 2023-11-14 ENCOUNTER — Ambulatory Visit (INDEPENDENT_AMBULATORY_CARE_PROVIDER_SITE_OTHER): Payer: Self-pay | Admitting: Family Medicine

## 2023-11-14 VITALS — BP 130/72 | HR 81 | Temp 98.3°F | Ht 68.5 in | Wt 159.0 lb

## 2023-11-14 DIAGNOSIS — K4090 Unilateral inguinal hernia, without obstruction or gangrene, not specified as recurrent: Secondary | ICD-10-CM

## 2023-11-14 DIAGNOSIS — Z1283 Encounter for screening for malignant neoplasm of skin: Secondary | ICD-10-CM

## 2023-11-14 MED ORDER — ESCITALOPRAM OXALATE 10 MG PO TABS: ORAL_TABLET | ORAL | 3 refills | Status: AC

## 2023-11-14 NOTE — Progress Notes (Signed)
 Subjective:    Patient ID: Marilyn Leonard, female    DOB: 11-01-1980, 43 y.o.   MRN: [id number]  HPI  Patient is a very pleasant 43 year old Caucasian female who presents today to get a refill on her Lexapro .  She states that the medication helps control anxiety.  Without the medication, she deals with stress and anxiety poorly.  On the medication she feels that this helps control her anxiety levels and prevent panic attacks.  Since I last saw her she has only had to take 2 Xanax .  She denies any chest pain, shortness of breath, dyspnea on exertion.  Her Pap smear and her mammogram were performed by her gynecologist.  Her tetanus shot is up-to-date.  She is requesting a referral to see a Development worker, international aid.  She has a longstanding history of an inguinal hernia that has been present for more than 20 years.  She is now having pain whenever she tries to exercise.  Second she would like to see a dermatologist.  She has solar lint ago starting to form on her left cheek.  She would panel symptoms recently established with a dermatologist.  Past Medical History:  Diagnosis Date   Angio-edema    Anxiety    Past Surgical History:  Procedure Laterality Date   APPENDECTOMY     BREAST SURGERY  2005   L Breast Lumpectomy- benign   CERVICAL FUSION  2008   CERVICAL FUSION  2010   ROTATOR CUFF REPAIR Right 1996   Current Outpatient Medications on File Prior to Visit  Medication Sig Dispense Refill   ALPRAZolam  (XANAX ) 0.5 MG tablet Take 1 tablet (0.5 mg total) by mouth 2 (two) times daily as needed for anxiety. 20 tablet 0   escitalopram  (LEXAPRO ) 10 MG tablet TAKE 1 TABLET BY MOUTH EVERY DAY 90 tablet 2   No current facility-administered medications on file prior to visit.   Allergies  Allergen Reactions   Decongestant [Oxymetazoline] Swelling    Any decongestant cause swelling   Social History   Socioeconomic History   Marital status: Married    Spouse name: Not on file   Number of children: 2    Years of education: Not on file   Highest education level: Associate degree: occupational, Scientist, product/process development, or vocational program  Occupational History   Occupation: Accounting  Tobacco Use   Smoking status: Never   Smokeless tobacco: Never  Vaping Use   Vaping status: Never Used  Substance and Sexual Activity   Alcohol use: Not Currently   Drug use: Never   Sexual activity: Yes  Other Topics Concern   Not on file  Social History Narrative   Not on file   Social Drivers of Health   Financial Resource Strain: Low Risk  (11/13/2023)   Overall Financial Resource Strain (CARDIA)    Difficulty of Paying Living Expenses: Not hard at all  Food Insecurity: No Food Insecurity (11/13/2023)   Hunger Vital Sign    Worried About Running Out of Food in the Last Year: Never true    Ran Out of Food in the Last Year: Never true  Transportation Needs: No Transportation Needs (11/13/2023)   PRAPARE - Administrator, Civil Service (Medical): No    Lack of Transportation (Non-Medical): No  Physical Activity: Sufficiently Active (11/13/2023)   Exercise Vital Sign    Days of Exercise per Week: 5 days    Minutes of Exercise per Session: 30 min  Stress: Stress Concern Present (11/13/2023)  [employer] of Occupational Health - Occupational Stress Questionnaire    Feeling of Stress : Rather much  Social Connections: Moderately Integrated (11/13/2023)   Social Connection and Isolation Panel [NHANES]    Frequency of Communication with Friends and Family: More than three times a week    Frequency of Social Gatherings with Friends and Family: More than three times a week    Attends Religious Services: 1 to 4 times per year    Active Member of [employer] or Organizations: No    Attends Engineer, structural: Not on file    Marital Status: Married  Catering manager Violence: Not on file   Family History  Problem Relation Age of Onset   Eczema Sister    Food Allergy Sister    Food Allergy  Brother    Allergic rhinitis Brother    Breast cancer Mother 45   Heart disease Father    Hypertension Father    Kidney disease Father    Angioedema Neg Hx    Asthma Neg Hx    Urticaria Neg Hx    Immunodeficiency Neg Hx      Review of Systems  All other systems reviewed and are negative.      Objective:   Physical Exam Vitals reviewed.  Constitutional:      General: She is not in acute distress.    Appearance: Normal appearance. She is normal weight. She is not ill-appearing, toxic-appearing or diaphoretic.  HENT:     Head: Normocephalic and atraumatic.     Right Ear: Tympanic membrane and ear canal normal.     Left Ear: Tympanic membrane and ear canal normal.     Nose: Nose normal. No congestion or rhinorrhea.     Mouth/Throat:     Mouth: Mucous membranes are moist.     Pharynx: Oropharynx is clear. No oropharyngeal exudate or posterior oropharyngeal erythema.  Eyes:     Conjunctiva/sclera: Conjunctivae normal.  Neck:     Vascular: No carotid bruit.  Cardiovascular:     Rate and Rhythm: Normal rate and regular rhythm.     Pulses: Normal pulses.     Heart sounds: Normal heart sounds. No murmur heard.    No friction rub. No gallop.  Pulmonary:     Effort: Pulmonary effort is normal. No respiratory distress.     Breath sounds: Normal breath sounds. No wheezing, rhonchi or rales.  Abdominal:     General: Abdomen is flat. Bowel sounds are normal. There is no distension.     Palpations: Abdomen is soft.     Tenderness: There is no abdominal tenderness. There is no guarding or rebound.     Hernia: A hernia is present. Hernia is present in the left inguinal area.  Musculoskeletal:     Cervical back: Normal range of motion and neck supple. No rigidity.     Right lower leg: No edema.     Left lower leg: No edema.  Lymphadenopathy:     Cervical: No cervical adenopathy.  Skin:    Coloration: Skin is not jaundiced.     Findings: No bruising, erythema, lesion or rash.   Neurological:     General: No focal deficit present.     Mental Status: She is alert and oriented to person, place, and time. Mental status is at baseline.     Cranial Nerves: No cranial nerve deficit.     Sensory: No sensory deficit.     Motor: No weakness.     Coordination:  Coordination normal.     Gait: Gait normal.  Psychiatric:        Mood and Affect: Mood normal.        Behavior: Behavior normal.        Thought Content: Thought content normal.        Judgment: Judgment normal.          Assessment & Plan:  Non-recurrent unilateral inguinal hernia without obstruction or gangrene - Plan: Ambulatory referral to General Surgery, CBC with Differential/Platelet, COMPLETE METABOLIC PANEL WITHOUT GFR  Skin exam, screening for cancer - Plan: Ambulatory referral to Dermatology I will schedule the patient to meet with a general surgeon to discuss her surgical options to treat her left inguinal hernia.  I will obtain baseline lab work including CBC and CMP.  Patient has no contraindications to surgery.  As requested I will schedule her to meet with a dermatologist.  Of asked the patient to contact me after she turns 43 so that I can schedule her for colonoscopy.  Defer her mammogram and her Pap smear to her gynecologist.  I refilled her Lexapro  for the next year.  She benefits from the medication to help control anxiety.

## 2023-11-15 LAB — COMPLETE METABOLIC PANEL WITHOUT GFR
AG Ratio: 1.6 (calc) (ref 1.0–2.5)
ALT: 17 U/L (ref 6–29)
AST: 16 U/L (ref 10–30)
Albumin: 4.4 g/dL (ref 3.6–5.1)
Alkaline phosphatase (APISO): 51 U/L (ref 31–125)
BUN: 20 mg/dL (ref 7–25)
CO2: 26 mmol/L (ref 20–32)
Calcium: 9.8 mg/dL (ref 8.6–10.2)
Chloride: 108 mmol/L (ref 98–110)
Creat: 0.78 mg/dL (ref 0.50–0.99)
Globulin: 2.8 g/dL (ref 1.9–3.7)
Glucose, Bld: 106 mg/dL — ABNORMAL HIGH (ref 65–99)
Potassium: 4 mmol/L (ref 3.5–5.3)
Sodium: 140 mmol/L (ref 135–146)
Total Bilirubin: 0.5 mg/dL (ref 0.2–1.2)
Total Protein: 7.2 g/dL (ref 6.1–8.1)

## 2023-11-15 LAB — CBC WITH DIFFERENTIAL/PLATELET
Absolute Lymphocytes: 1946 {cells}/uL (ref 850–3900)
Absolute Monocytes: 570 {cells}/uL (ref 200–950)
Basophils Absolute: 59 {cells}/uL (ref 0–200)
Basophils Relative: 0.8 %
Eosinophils Absolute: 222 {cells}/uL (ref 15–500)
Eosinophils Relative: 3 %
HCT: 37.5 % (ref 35.0–45.0)
Hemoglobin: 12.6 g/dL (ref 11.7–15.5)
MCH: 31.1 pg (ref 27.0–33.0)
MCHC: 33.6 g/dL (ref 32.0–36.0)
MCV: 92.6 fL (ref 80.0–100.0)
MPV: 11.1 fL (ref 7.5–12.5)
Monocytes Relative: 7.7 %
Neutro Abs: 4603 {cells}/uL (ref 1500–7800)
Neutrophils Relative %: 62.2 %
Platelets: 267 10*3/uL (ref 140–400)
RBC: 4.05 10*6/uL (ref 3.80–5.10)
RDW: 11.8 % (ref 11.0–15.0)
Total Lymphocyte: 26.3 %
WBC: 7.4 10*3/uL (ref 3.8–10.8)

## 2023-12-01 DIAGNOSIS — K4090 Unilateral inguinal hernia, without obstruction or gangrene, not specified as recurrent: Secondary | ICD-10-CM | POA: Diagnosis not present

## 2023-12-14 ENCOUNTER — Encounter: Payer: Self-pay | Admitting: Family Medicine

## 2023-12-14 DIAGNOSIS — L237 Allergic contact dermatitis due to plants, except food: Secondary | ICD-10-CM | POA: Diagnosis not present

## 2023-12-14 DIAGNOSIS — L089 Local infection of the skin and subcutaneous tissue, unspecified: Secondary | ICD-10-CM | POA: Diagnosis not present

## 2024-01-04 ENCOUNTER — Encounter: Payer: Self-pay | Admitting: Family Medicine

## 2024-01-05 ENCOUNTER — Other Ambulatory Visit: Payer: Self-pay | Admitting: Family Medicine

## 2024-01-05 MED ORDER — POLYMYXIN B-TRIMETHOPRIM 10000-0.1 UNIT/ML-% OP SOLN: 2.0000 [drp] | OPHTHALMIC | 0 refills | Status: DC

## 2024-01-10 DIAGNOSIS — R070 Pain in throat: Secondary | ICD-10-CM | POA: Diagnosis not present

## 2024-01-10 DIAGNOSIS — J028 Acute pharyngitis due to other specified organisms: Secondary | ICD-10-CM | POA: Diagnosis not present

## 2024-01-25 ENCOUNTER — Encounter (HOSPITAL_COMMUNITY)

## 2024-02-01 ENCOUNTER — Ambulatory Visit: Payer: Self-pay | Admitting: Surgery

## 2024-02-01 ENCOUNTER — Encounter (HOSPITAL_COMMUNITY): Payer: Self-pay

## 2024-02-01 NOTE — Progress Notes (Signed)
 Sent message, via epic in basket, requesting orders in epic from Careers adviser.

## 2024-02-01 NOTE — H&P (Signed)
 Philippe Catholic [id number]   Referring Provider: Duanne Butler DASEN, MD  Subjective   Chief Complaint: New Consultation   History of Present Illness:  43yo woman with history of angioedema, anxiety who presents for evaluation of an inguinal hernia. This has been present more than 20 years and until recently was asymptomatic. Now having pain when she tries to exercise. She states that it will protrude at certain times a day with certain activities. It was previously easily reducible and soft, but now appears more firm. She also notes some difficulty with bowel movements recently but no bloating or nausea. Denies any previous abdominal surgery; appendectomy is listed in her epic chart.  Labs 5/5: CMP and CBC wnl  Review of Systems: A complete review of systems was obtained from the patient. I have reviewed this information and discussed as appropriate with the patient. See HPI as well for other ROS.  Medical History: History reviewed. No pertinent past medical history.  There is no problem list on file for this patient.  Past Surgical History:  Procedure Laterality Date  Neck surgery    No Known Allergies  No current outpatient medications on file prior to visit.   No current facility-administered medications on file prior to visit.   Family History  Problem Relation Age of Onset  Breast cancer Mother  High blood pressure (Hypertension) Father  High blood pressure (Hypertension) Brother    Social History   Tobacco Use  Smoking Status Never  Smokeless Tobacco Never    Social History   Socioeconomic History  Marital status: Married  Tobacco Use  Smoking status: Never  Smokeless tobacco: Never  Substance and Sexual Activity  Alcohol use: Never  Drug use: Never   Social Drivers of Corporate investment banker Strain: Low Risk (11/13/2023)  Received from [facility]  Overall Financial Resource Strain (CARDIA)  Difficulty of Paying Living Expenses: Not hard at all   Food Insecurity: No Food Insecurity (11/13/2023)  Received from [facility]  Hunger Vital Sign  Worried About Running Out of Food in the Last Year: Never true  Ran Out of Food in the Last Year: Never true  Transportation Needs: No Transportation Needs (11/13/2023)  Received from [facility] - Transportation  Lack of Transportation (Medical): No  Lack of Transportation (Non-Medical): No  Physical Activity: Sufficiently Active (11/13/2023)  Received from [facility]  Exercise Vital Sign  Days of Exercise per Week: 5 days  Minutes of Exercise per Session: 30 min  Stress: Stress Concern Present (11/13/2023)  Received from [facility] - Occupational Stress Questionnaire  Feeling of Stress : Rather much  Social Connections: Moderately Integrated (11/13/2023)  Received from [facility]  Social Connection and Isolation Panel [NHANES]  Frequency of Communication with Friends and Family: More than three times a week  Frequency of Social Gatherings with Friends and Family: More than three times a week  Attends Religious Services: 1 to 4 times per year  Active Member of Clubs or Organizations: No  Marital Status: Married  Housing Stability: Unknown (12/01/2023)  Housing Stability Vital Sign  Homeless in the Last Year: No   Objective:   Vitals:  12/01/23 1003 12/01/23 1004  BP: 120/70  Pulse: 98  Temp: 36.7 C (98 F)  SpO2: 99%  Weight: 70.9 kg (156 lb 6.4 oz)  Height: 174 cm (5' 8.5)  PainSc: 0-No pain   Body mass index is 23.43 kg/m.  Gen: A&Ox3, no distress  Chest: respiratory effort is normal. Abdomen: soft, nondistended, nontender. Small, tender left inguinal hernia Neuro: no gross deficit Psych: appropriate mood and affect, normal insight/judgment intact  Skin: warm and dry  Assessment and Plan:  Diagnoses and all orders for this visit:  Non-recurrent unilateral inguinal hernia without obstruction or gangrene   We  discussed the relevant anatomy and we discussed options for repair. I recommend a robotic approach and went over the technique of the procedure. Discussed risks of bleeding, infection, pain, scarring, injury to structures in the area including nerves, blood vessels, bowel, or bladder; risk of chronic pain, hernia recurrence, risk of seroma or hematoma, urinary retention, and risks of general anesthesia including cardiovascular, pulmonary, and thromboembolic complications. We discussed typical postop recovery, timeline, and activity limitations. Questions were welcomed and answered to the patient's satisfaction. Patient wishes to proceed with scheduling with a goal of having surgery in August when she will be more able to have 6 weeks of limited lifting/straining.SABRA Mitzie Freund MD FACS

## 2024-02-01 NOTE — H&P (View-Only) (Signed)
 Marilyn Leonard [id number]   Referring Provider: Duanne Butler DASEN, MD  Subjective   Chief Complaint: New Consultation   History of Present Illness:  43yo woman with history of angioedema, anxiety who presents for evaluation of an inguinal hernia. This has been present more than 20 years and until recently was asymptomatic. Now having pain when she tries to exercise. She states that it will protrude at certain times a day with certain activities. It was previously easily reducible and soft, but now appears more firm. She also notes some difficulty with bowel movements recently but no bloating or nausea. Denies any previous abdominal surgery; appendectomy is listed in her epic chart.  Labs 5/5: CMP and CBC wnl  Review of Systems: A complete review of systems was obtained from the patient. I have reviewed this information and discussed as appropriate with the patient. See HPI as well for other ROS.  Medical History: History reviewed. No pertinent past medical history.  There is no problem list on file for this patient.  Past Surgical History:  Procedure Laterality Date  Neck surgery    No Known Allergies  No current outpatient medications on file prior to visit.   No current facility-administered medications on file prior to visit.   Family History  Problem Relation Age of Onset  Breast cancer Mother  High blood pressure (Hypertension) Father  High blood pressure (Hypertension) Brother    Social History   Tobacco Use  Smoking Status Never  Smokeless Tobacco Never    Social History   Socioeconomic History  Marital status: Married  Tobacco Use  Smoking status: Never  Smokeless tobacco: Never  Substance and Sexual Activity  Alcohol use: Never  Drug use: Never   Social Drivers of Corporate investment banker Strain: Low Risk (11/13/2023)  Received from [facility]  Overall Financial Resource Strain (CARDIA)  Difficulty of Paying Living Expenses: Not hard at all   Food Insecurity: No Food Insecurity (11/13/2023)  Received from [facility]  Hunger Vital Sign  Worried About Running Out of Food in the Last Year: Never true  Ran Out of Food in the Last Year: Never true  Transportation Needs: No Transportation Needs (11/13/2023)  Received from [facility] - Transportation  Lack of Transportation (Medical): No  Lack of Transportation (Non-Medical): No  Physical Activity: Sufficiently Active (11/13/2023)  Received from [facility]  Exercise Vital Sign  Days of Exercise per Week: 5 days  Minutes of Exercise per Session: 30 min  Stress: Stress Concern Present (11/13/2023)  Received from [facility] - Occupational Stress Questionnaire  Feeling of Stress : Rather much  Social Connections: Moderately Integrated (11/13/2023)  Received from [facility]  Social Connection and Isolation Panel [NHANES]  Frequency of Communication with Friends and Family: More than three times a week  Frequency of Social Gatherings with Friends and Family: More than three times a week  Attends Religious Services: 1 to 4 times per year  Active Member of Clubs or Organizations: No  Marital Status: Married  Housing Stability: Unknown (12/01/2023)  Housing Stability Vital Sign  Homeless in the Last Year: No   Objective:   Vitals:  12/01/23 1003 12/01/23 1004  BP: 120/70  Pulse: 98  Temp: 36.7 C (98 F)  SpO2: 99%  Weight: 70.9 kg (156 lb 6.4 oz)  Height: 174 cm (5' 8.5)  PainSc: 0-No pain   Body mass index is 23.43 kg/m.  Gen: A&Ox3, no distress  Chest: respiratory effort is normal. Abdomen: soft, nondistended, nontender. Small, tender left inguinal hernia Neuro: no gross deficit Psych: appropriate mood and affect, normal insight/judgment intact  Skin: warm and dry  Assessment and Plan:  Diagnoses and all orders for this visit:  Non-recurrent unilateral inguinal hernia without obstruction or gangrene   We  discussed the relevant anatomy and we discussed options for repair. I recommend a robotic approach and went over the technique of the procedure. Discussed risks of bleeding, infection, pain, scarring, injury to structures in the area including nerves, blood vessels, bowel, or bladder; risk of chronic pain, hernia recurrence, risk of seroma or hematoma, urinary retention, and risks of general anesthesia including cardiovascular, pulmonary, and thromboembolic complications. We discussed typical postop recovery, timeline, and activity limitations. Questions were welcomed and answered to the patient's satisfaction. Patient wishes to proceed with scheduling with a goal of having surgery in August when she will be more able to have 6 weeks of limited lifting/straining.Marilyn Mitzie Freund MD FACS

## 2024-02-07 NOTE — Progress Notes (Signed)
 COVID Vaccine Completed: yes  Date of COVID positive in last 90 days:  PCP - Butler Burr, MD Cardiologist - n/a  Chest x-ray - n/a EKG - n/a Stress Test - n/a ECHO - n/a Cardiac Cath - n/a Pacemaker/ICD device last checked: n/a Spinal Cord Stimulator: n/a  Bowel Prep - no  Sleep Study - n/a CPAP -   Fasting Blood Sugar - n/a Checks Blood Sugar _____ times a day  Last dose of GLP1 agonist-  N/A GLP1 instructions:  Do not take after     Last dose of SGLT-2 inhibitors-  N/A SGLT-2 instructions:  Do not take after     Blood Thinner Instructions:  Last dose:  N/a Time: Aspirin Instructions: Last Dose:  Activity level: Can go up a flight of stairs and perform activities of daily living without stopping and without symptoms of chest pain or shortness of breath.   Anesthesia review:   Patient denies shortness of breath, fever, cough and chest pain at PAT appointment  Patient verbalized understanding of instructions that were given to them at the PAT appointment. Patient was also instructed that they will need to review over the PAT instructions again at home before surgery.

## 2024-02-07 NOTE — Patient Instructions (Signed)
 SURGICAL WAITING ROOM VISITATION  Patients having surgery or a procedure may have no more than 2 support people in the waiting area - these visitors may rotate.    Children under the age of 5 must have an adult with them who is not the patient.  Visitors with respiratory illnesses are discouraged from visiting and should remain at home.  If the patient needs to stay at the hospital during part of their recovery, the visitor guidelines for inpatient rooms apply. Pre-op nurse will coordinate an appropriate time for 1 support person to accompany patient in pre-op.  This support person may not rotate.    Please refer to the [facility] website for the visitor guidelines for Inpatients (after your surgery is over and you are in a regular room).    Your procedure is scheduled on: 02/24/24   Report to [facility] Main Entrance    Report to admitting at 10:45 AM   Call this number if you have problems the morning of surgery [phone]   Do not eat food  or drink liquids :After Midnight.          If you have questions, please contact your surgeon's office.   FOLLOW BOWEL PREP AND ANY ADDITIONAL PRE OP INSTRUCTIONS YOU RECEIVED FROM YOUR SURGEON'S OFFICE!!!     Oral Hygiene is also important to reduce your risk of infection.                                    Remember - BRUSH YOUR TEETH THE MORNING OF SURGERY WITH YOUR REGULAR TOOTHPASTE  DENTURES WILL BE REMOVED PRIOR TO SURGERY PLEASE DO NOT APPLY Poly grip OR ADHESIVES!!!   Stop all vitamins and herbal supplements 7 days before surgery.   Take these medicines the morning of surgery with A SIP OF WATER: Xanax , Lexapro                               You may not have any metal on your body including hair pins, jewelry, and body piercing             Do not wear make-up, lotions, powders, perfumes, or deodorant  Do not wear nail polish including gel and S&S, artificial/acrylic nails, or any other type of covering on natural  nails including finger and toenails. If you have artificial nails, gel coating, etc. that needs to be removed by a nail salon please have this removed prior to surgery or surgery may need to be canceled/ delayed if the surgeon/ anesthesia feels like they are unable to be safely monitored.   Do not shave  48 hours prior to surgery.    Do not bring valuables to the hospital. [facility] IS NOT             RESPONSIBLE   FOR VALUABLES.   Contacts, glasses, dentures or bridgework may not be worn into surgery.   Bring small overnight bag day of surgery.   DO NOT BRING YOUR HOME MEDICATIONS TO THE HOSPITAL. PHARMACY WILL DISPENSE MEDICATIONS LISTED ON YOUR MEDICATION LIST TO YOU DURING YOUR ADMISSION IN THE HOSPITAL!    Patients discharged on the day of surgery will not be allowed to drive home.  Someone NEEDS to stay with you for the first 24 hours after anesthesia.  Please read over the following fact sheets you were given: IF YOU HAVE QUESTIONS ABOUT YOUR PRE-OP INSTRUCTIONS PLEASE CALL [phone].   If you received a COVID test during your pre-op visit  it is requested that you wear a mask when out in public, stay away from anyone that may not be feeling well and notify your surgeon if you develop symptoms. If you test positive for Covid or have been in contact with anyone that has tested positive in the last 10 days please notify you surgeon.    [facility] - Preparing for Surgery Before surgery, you can play an important role.  Because skin is not sterile, your skin needs to be as free of germs as possible.  You can reduce the number of germs on your skin by washing with CHG (chlorahexidine gluconate) soap before surgery.  CHG is an antiseptic cleaner which kills germs and bonds with the skin to continue killing germs even after washing. Please DO NOT use if you have an allergy to CHG or antibacterial soaps.  If your skin becomes reddened/irritated stop using the CHG and  inform your nurse when you arrive at Short Stay. Do not shave (including legs and underarms) for at least 48 hours prior to the first CHG shower.  You may shave your face/neck.  Please follow these instructions carefully:  1.  Shower with CHG Soap the night before surgery and the  morning of surgery.  2.  If you choose to wash your hair, wash your hair first as usual with your normal  shampoo.  3.  After you shampoo, rinse your hair and body thoroughly to remove the shampoo.                             4.  Use CHG as you would any other liquid soap.  You can apply chg directly to the skin and wash.  Gently with a scrungie or clean washcloth.  5.  Apply the CHG Soap to your body ONLY FROM THE NECK DOWN.   Do   not use on face/ open                           Wound or open sores. Avoid contact with eyes, ears mouth and   genitals (private parts).                       Wash face,  Genitals (private parts) with your normal soap.             6.  Wash thoroughly, paying special attention to the area where your    surgery  will be performed.  7.  Thoroughly rinse your body with warm water from the neck down.  8.  DO NOT shower/wash with your normal soap after using and rinsing off the CHG Soap.                9.  Pat yourself dry with a clean towel.            10.  Wear clean pajamas.            11.  Place clean sheets on your bed the night of your first shower and do not  sleep with pets. Day of Surgery : Do not apply any lotions/deodorants the morning of surgery.  Please wear clean clothes to the hospital/surgery center.  FAILURE TO FOLLOW THESE INSTRUCTIONS MAY RESULT IN THE CANCELLATION OF YOUR SURGERY  PATIENT SIGNATURE_________________________________  NURSE SIGNATURE__________________________________  ________________________________________________________________________

## 2024-02-08 ENCOUNTER — Encounter (HOSPITAL_COMMUNITY)
Admission: RE | Admit: 2024-02-08 | Discharge: 2024-02-08 | Disposition: A | Discharge status: Home / Self Care | Source: Ambulatory Visit | Attending: Surgery | Admitting: Surgery

## 2024-02-08 ENCOUNTER — Encounter (HOSPITAL_COMMUNITY): Payer: Self-pay

## 2024-02-08 ENCOUNTER — Other Ambulatory Visit: Payer: Self-pay

## 2024-02-08 VITALS — BP 112/80 | HR 65 | Temp 98.1°F | Resp 14 | Ht 68.5 in | Wt 155.0 lb

## 2024-02-08 DIAGNOSIS — Z01818 Encounter for other preprocedural examination: Secondary | ICD-10-CM | POA: Diagnosis not present

## 2024-02-08 HISTORY — DX: Other specified postprocedural states: Z98.890

## 2024-02-08 LAB — CBC
HCT: 36.6 % (ref 36.0–46.0)
Hemoglobin: 12.4 g/dL (ref 12.0–15.0)
MCH: 30.8 pg (ref 26.0–34.0)
MCHC: 33.9 g/dL (ref 30.0–36.0)
MCV: 91 fL (ref 80.0–100.0)
Platelets: 274 K/uL (ref 150–400)
RBC: 4.02 MIL/uL (ref 3.87–5.11)
RDW: 12.1 % (ref 11.5–15.5)
WBC: 6.8 K/uL (ref 4.0–10.5)
nRBC: 0 % (ref 0.0–0.2)

## 2024-02-24 ENCOUNTER — Encounter (HOSPITAL_COMMUNITY): Payer: Self-pay | Admitting: Surgery

## 2024-02-24 ENCOUNTER — Other Ambulatory Visit: Payer: Self-pay

## 2024-02-24 ENCOUNTER — Ambulatory Visit (HOSPITAL_COMMUNITY): Admitting: Anesthesiology

## 2024-02-24 ENCOUNTER — Ambulatory Visit (HOSPITAL_COMMUNITY)
Admission: RE | Admit: 2024-02-24 | Discharge: 2024-02-24 | Disposition: A | Discharge status: Home / Self Care | Attending: Surgery | Admitting: Surgery

## 2024-02-24 ENCOUNTER — Ambulatory Visit (HOSPITAL_COMMUNITY): Payer: Self-pay | Admitting: Medical

## 2024-02-24 ENCOUNTER — Encounter (HOSPITAL_COMMUNITY)
Admission: RE | Disposition: A | Discharge status: Home / Self Care | Payer: Self-pay | Source: Home / Self Care | Attending: Surgery

## 2024-02-24 DIAGNOSIS — K4130 Unilateral femoral hernia, with obstruction, without gangrene, not specified as recurrent: Secondary | ICD-10-CM | POA: Insufficient documentation

## 2024-02-24 DIAGNOSIS — K4190 Unilateral femoral hernia, without obstruction or gangrene, not specified as recurrent: Secondary | ICD-10-CM | POA: Diagnosis not present

## 2024-02-24 DIAGNOSIS — K4090 Unilateral inguinal hernia, without obstruction or gangrene, not specified as recurrent: Secondary | ICD-10-CM | POA: Diagnosis not present

## 2024-02-24 LAB — GLUCOSE, CAPILLARY: Glucose-Capillary: 93 mg/dL (ref 70–99)

## 2024-02-24 LAB — POCT PREGNANCY, URINE: Preg Test, Ur: NEGATIVE

## 2024-02-24 SURGERY — REPAIR, HERNIA, INGUINAL, BILATERAL, ROBOT-ASSISTED
Anesthesia: General | Laterality: Left

## 2024-02-24 MED ORDER — ROCURONIUM BROMIDE 10 MG/ML (PF) SYRINGE
PREFILLED_SYRINGE | INTRAVENOUS | Status: AC
  Filled 2024-02-24: qty 10

## 2024-02-24 MED ORDER — SODIUM CHLORIDE 0.9 % IR SOLN
Status: DC | PRN
  Administered 2024-02-24: 1000 mL

## 2024-02-24 MED ORDER — CEFAZOLIN SODIUM-DEXTROSE 2-4 GM/100ML-% IV SOLN
2.0000 g | INTRAVENOUS | Status: AC
  Administered 2024-02-24: 2 g via INTRAVENOUS
  Filled 2024-02-24: qty 100

## 2024-02-24 MED ORDER — BUPIVACAINE LIPOSOME 1.3 % IJ SUSP
INTRAMUSCULAR | Status: DC | PRN
  Administered 2024-02-24: 20 mL

## 2024-02-24 MED ORDER — SUGAMMADEX SODIUM 200 MG/2ML IV SOLN
INTRAVENOUS | Status: AC
  Filled 2024-02-24: qty 2

## 2024-02-24 MED ORDER — ORAL CARE MOUTH RINSE: 15.0000 mL | Freq: Once | OROMUCOSAL | Status: AC

## 2024-02-24 MED ORDER — OXYCODONE HCL 5 MG/5ML PO SOLN: 5.0000 mg | Freq: Once | ORAL | Status: AC | PRN

## 2024-02-24 MED ORDER — DOCUSATE SODIUM 100 MG PO CAPS: 100.0000 mg | ORAL_CAPSULE | Freq: Two times a day (BID) | ORAL | 0 refills | Status: AC

## 2024-02-24 MED ORDER — PROPOFOL 500 MG/50ML IV EMUL
INTRAVENOUS | Status: AC
  Filled 2024-02-24: qty 50

## 2024-02-24 MED ORDER — LACTATED RINGERS IV SOLN: INTRAVENOUS | Status: DC

## 2024-02-24 MED ORDER — DEXAMETHASONE SODIUM PHOSPHATE 10 MG/ML IJ SOLN
INTRAMUSCULAR | Status: AC
  Filled 2024-02-24: qty 1

## 2024-02-24 MED ORDER — ACETAMINOPHEN 500 MG PO TABS
1000.0000 mg | ORAL_TABLET | ORAL | Status: AC
  Administered 2024-02-24: 1000 mg via ORAL
  Filled 2024-02-24: qty 2

## 2024-02-24 MED ORDER — DEXAMETHASONE SODIUM PHOSPHATE 10 MG/ML IJ SOLN
INTRAMUSCULAR | Status: DC | PRN
Start: 2024-02-24 — End: 2024-02-24
  Administered 2024-02-24: 8 mg via INTRAVENOUS

## 2024-02-24 MED ORDER — ACETAMINOPHEN 10 MG/ML IV SOLN: 1000.0000 mg | Freq: Once | INTRAVENOUS | Status: DC | PRN

## 2024-02-24 MED ORDER — SUGAMMADEX SODIUM 200 MG/2ML IV SOLN
INTRAVENOUS | Status: DC | PRN
Start: 2024-02-24 — End: 2024-02-24
  Administered 2024-02-24: 200 mg via INTRAVENOUS

## 2024-02-24 MED ORDER — ONDANSETRON HCL 4 MG/2ML IJ SOLN
INTRAMUSCULAR | Status: DC | PRN
  Administered 2024-02-24: 4 mg via INTRAVENOUS

## 2024-02-24 MED ORDER — MIDAZOLAM HCL 2 MG/2ML IJ SOLN
INTRAMUSCULAR | Status: AC
  Filled 2024-02-24: qty 2

## 2024-02-24 MED ORDER — FENTANYL CITRATE (PF) 250 MCG/5ML IJ SOLN
INTRAMUSCULAR | Status: DC | PRN
  Administered 2024-02-24: 50 ug via INTRAVENOUS
  Administered 2024-02-24: 100 ug via INTRAVENOUS
  Administered 2024-02-24 (×2): 50 ug via INTRAVENOUS

## 2024-02-24 MED ORDER — FENTANYL CITRATE (PF) 250 MCG/5ML IJ SOLN
INTRAMUSCULAR | Status: AC
  Filled 2024-02-24: qty 5

## 2024-02-24 MED ORDER — BUPIVACAINE-EPINEPHRINE (PF) 0.25% -1:200000 IJ SOLN
INTRAMUSCULAR | Status: AC
Start: 2024-02-24 — End: 2024-02-24
  Filled 2024-02-24: qty 30

## 2024-02-24 MED ORDER — BUPIVACAINE LIPOSOME 1.3 % IJ SUSP: 20.0000 mL | Freq: Once | INTRAMUSCULAR | Status: DC

## 2024-02-24 MED ORDER — OXYCODONE HCL 5 MG PO TABS
ORAL_TABLET | ORAL | Status: AC
  Filled 2024-02-24: qty 1

## 2024-02-24 MED ORDER — CHLORHEXIDINE GLUCONATE 4 % EX SOLN: 60.0000 mL | Freq: Once | CUTANEOUS | Status: DC

## 2024-02-24 MED ORDER — HYDROMORPHONE HCL 2 MG/ML IJ SOLN
INTRAMUSCULAR | Status: AC
  Filled 2024-02-24: qty 1

## 2024-02-24 MED ORDER — ROCURONIUM BROMIDE 10 MG/ML (PF) SYRINGE
PREFILLED_SYRINGE | INTRAVENOUS | Status: DC | PRN
Start: 2024-02-24 — End: 2024-02-24
  Administered 2024-02-24: 20 mg via INTRAVENOUS
  Administered 2024-02-24: 60 mg via INTRAVENOUS

## 2024-02-24 MED ORDER — OXYCODONE HCL 5 MG PO TABS: 5.0000 mg | ORAL_TABLET | Freq: Three times a day (TID) | ORAL | 0 refills | Status: AC | PRN

## 2024-02-24 MED ORDER — PROPOFOL 10 MG/ML IV BOLUS
INTRAVENOUS | Status: AC
  Filled 2024-02-24: qty 20

## 2024-02-24 MED ORDER — OXYCODONE HCL 5 MG PO TABS
5.0000 mg | ORAL_TABLET | Freq: Once | ORAL | Status: AC | PRN
  Administered 2024-02-24: 5 mg via ORAL

## 2024-02-24 MED ORDER — HYDROMORPHONE HCL 1 MG/ML IJ SOLN
INTRAMUSCULAR | Status: DC | PRN
  Administered 2024-02-24: 1 mg via INTRAVENOUS

## 2024-02-24 MED ORDER — LIDOCAINE HCL (PF) 2 % IJ SOLN
INTRAMUSCULAR | Status: DC | PRN
Start: 2024-02-24 — End: 2024-02-24
  Administered 2024-02-24: 100 mg via INTRADERMAL

## 2024-02-24 MED ORDER — ONDANSETRON HCL 4 MG/2ML IJ SOLN
INTRAMUSCULAR | Status: AC
  Filled 2024-02-24: qty 2

## 2024-02-24 MED ORDER — LIDOCAINE HCL 1 % IJ SOLN
INTRAMUSCULAR | Status: AC
  Filled 2024-02-24: qty 20

## 2024-02-24 MED ORDER — BUPIVACAINE-EPINEPHRINE 0.25% -1:200000 IJ SOLN
INTRAMUSCULAR | Status: DC | PRN
  Administered 2024-02-24: 30 mL

## 2024-02-24 MED ORDER — PROPOFOL 500 MG/50ML IV EMUL
INTRAVENOUS | Status: DC | PRN
  Administered 2024-02-24: 150 ug/kg/min via INTRAVENOUS

## 2024-02-24 MED ORDER — GABAPENTIN 300 MG PO CAPS
300.0000 mg | ORAL_CAPSULE | ORAL | Status: AC
  Administered 2024-02-24: 300 mg via ORAL
  Filled 2024-02-24: qty 1

## 2024-02-24 MED ORDER — FENTANYL CITRATE PF 50 MCG/ML IJ SOSY: 25.0000 ug | PREFILLED_SYRINGE | INTRAMUSCULAR | Status: DC | PRN

## 2024-02-24 MED ORDER — BUPIVACAINE LIPOSOME 1.3 % IJ SUSP
INTRAMUSCULAR | Status: AC
  Filled 2024-02-24: qty 20

## 2024-02-24 MED ORDER — PROPOFOL 10 MG/ML IV BOLUS
INTRAVENOUS | Status: DC | PRN
  Administered 2024-02-24: 110 mg via INTRAVENOUS

## 2024-02-24 MED ORDER — ONDANSETRON HCL 4 MG/2ML IJ SOLN: 4.0000 mg | Freq: Once | INTRAMUSCULAR | Status: DC | PRN

## 2024-02-24 MED ORDER — CHLORHEXIDINE GLUCONATE 0.12 % MT SOLN
15.0000 mL | Freq: Once | OROMUCOSAL | Status: AC
  Administered 2024-02-24: 15 mL via OROMUCOSAL

## 2024-02-24 MED ORDER — MIDAZOLAM HCL 2 MG/2ML IJ SOLN
INTRAMUSCULAR | Status: DC | PRN
  Administered 2024-02-24: 2 mg via INTRAVENOUS

## 2024-02-24 SURGICAL SUPPLY — 47 items
APPLICATOR COTTON TIP 6 STRL (MISCELLANEOUS) ×2 IMPLANT
BAG COUNTER SPONGE SURGICOUNT (BAG) IMPLANT
BENZOIN TINCTURE PRP APPL 2/3 (GAUZE/BANDAGES/DRESSINGS) ×1 IMPLANT
BLADE SURG SZ11 CARB STEEL (BLADE) ×1 IMPLANT
BNDG ADH 1X3 SHEER STRL LF (GAUZE/BANDAGES/DRESSINGS) ×3 IMPLANT
CHLORAPREP W/TINT 26 (MISCELLANEOUS) ×1 IMPLANT
COVER SURGICAL LIGHT HANDLE (MISCELLANEOUS) ×1 IMPLANT
COVER TIP SHEARS 8 DVNC (MISCELLANEOUS) ×1 IMPLANT
DERMABOND ADVANCED .7 DNX12 (GAUZE/BANDAGES/DRESSINGS) IMPLANT
DRAPE ARM DVNC X/XI (DISPOSABLE) ×4 IMPLANT
DRAPE COLUMN DVNC XI (DISPOSABLE) ×1 IMPLANT
DRAPE CV SPLIT W-CLR ANES SCRN (DRAPES) ×1 IMPLANT
DRAPE PERI GROIN 82X75IN TIB (DRAPES) ×1 IMPLANT
DRAPE UTILITY XL STRL (DRAPES) ×1 IMPLANT
DRIVER NDL LRG 8 DVNC XI (INSTRUMENTS) ×1 IMPLANT
DRIVER NDL MEGA SUTCUT DVNCXI (INSTRUMENTS) ×2 IMPLANT
DRIVER NDLE LRG 8 DVNC XI (INSTRUMENTS) ×1 IMPLANT
DRIVER NDLE MEGA SUTCUT DVNCXI (INSTRUMENTS) ×1 IMPLANT
ELECT REM PT RETURN 15FT ADLT (MISCELLANEOUS) ×1 IMPLANT
GLOVE BIO SURGEON STRL SZ 6 (GLOVE) ×2 IMPLANT
GLOVE INDICATOR 6.5 STRL GRN (GLOVE) ×2 IMPLANT
GOWN STRL REUS W/ TWL LRG LVL3 (GOWN DISPOSABLE) ×2 IMPLANT
GRASPER TIP-UP FEN DVNC XI (INSTRUMENTS) ×1 IMPLANT
IRRIGATION SUCT STRKRFLW 2 WTP (MISCELLANEOUS) IMPLANT
KIT BASIN OR (CUSTOM PROCEDURE TRAY) ×1 IMPLANT
KIT TURNOVER KIT A (KITS) ×1 IMPLANT
MESH 3DMAX MID 4X6 LT LRG (Mesh General) IMPLANT
NDL HYPO 22X1.5 SAFETY MO (MISCELLANEOUS) ×1 IMPLANT
NDL INSUFFLATION 14GA 120MM (NEEDLE) ×1 IMPLANT
NEEDLE HYPO 22X1.5 SAFETY MO (MISCELLANEOUS) ×1 IMPLANT
NEEDLE INSUFFLATION 14GA 120MM (NEEDLE) ×1 IMPLANT
PACK BASIC VI WITH GOWN DISP (CUSTOM PROCEDURE TRAY) ×1 IMPLANT
PAD POSITIONING PINK XL (MISCELLANEOUS) ×1 IMPLANT
SCISSORS MNPLR CVD DVNC XI (INSTRUMENTS) ×1 IMPLANT
SEAL UNIV 5-12 XI (MISCELLANEOUS) ×3 IMPLANT
SOLUTION ANTFG W/FOAM PAD STRL (MISCELLANEOUS) ×1 IMPLANT
SOLUTION ELECTROSURG ANTI STCK (MISCELLANEOUS) ×1 IMPLANT
SPIKE FLUID TRANSFER (MISCELLANEOUS) ×1 IMPLANT
STRIP CLOSURE SKIN 1/2X4 (GAUZE/BANDAGES/DRESSINGS) ×1 IMPLANT
SUT MNCRL AB 4-0 PS2 18 (SUTURE) ×1 IMPLANT
SUT STRATA 2-0 15 CT-1.5 (SUTURE) IMPLANT
SUT VIC AB 3-0 SH 27XBRD (SUTURE) ×1 IMPLANT
SUT VLOC 3-0 9IN GRN (SUTURE) IMPLANT
SYR 10ML LL (SYRINGE) ×1 IMPLANT
SYR 20ML LL LF (SYRINGE) ×1 IMPLANT
TOWEL OR 17X26 10 PK STRL BLUE (TOWEL DISPOSABLE) ×1 IMPLANT
TUBING INSUFFLATION 10FT LAP (TUBING) ×1 IMPLANT

## 2024-02-24 NOTE — Interval H&P Note (Signed)
 History and Physical Interval Note:  02/24/2024 12:13 PM  Marilyn Leonard  has presented today for surgery, with the diagnosis of INGUINAL HERNIA.  The various methods of treatment have been discussed with the patient and family. After consideration of risks, benefits and other options for treatment, the patient has consented to  Procedure(s) with comments: REPAIR, HERNIA, INGUINAL, BILATERAL, ROBOT-ASSISTED (Left) - NOT BILATERAL - LEFT as a surgical intervention.  The patient's history has been reviewed, patient examined, no change in status, stable for surgery.  I have reviewed the patient's chart and labs.  Questions were answered to the patient's satisfaction.     Marilyn Leonard DELENA Freund

## 2024-02-24 NOTE — Discharge Instructions (Addendum)
 HERNIA REPAIR: POST OP INSTRUCTIONS   EAT Gradually transition to your usual diet over the next few days after discharge.  WALK Walk an hour a day (cumulative- not all at once).  Control your pain to do that.    CONTROL PAIN Control pain so that you can walk, sleep, tolerate sneezing/coughing, and go up/down stairs.  HAVE A BOWEL MOVEMENT DAILY Keep your bowels regular to avoid problems.  OK to try a laxative to override constipation.  OK to use an antidiarrheal to slow down diarrhea.  Call if not better after 2 tries  CALL IF YOU HAVE PROBLEMS/CONCERNS Call if you are still struggling despite following these instructions. Call if you have concerns not answered by these instructions    DIET: Follow a light bland diet & liquids the first 24 hours after arrival home, such as soup, liquids, starches, etc.  Be sure to drink plenty of fluids.  Quickly advance to a usual solid diet within a few days.  Avoid fast food or heavy meals initially as you are more likely to get nauseated or have irregular bowels.  Take your usually prescribed home medications unless otherwise directed.  PAIN CONTROL: Pain is best controlled by a usual combination of three different methods TOGETHER: Ice/Heat Over the counter pain medication Prescription pain medication Most patients will experience some swelling and bruising around the hernia(s) such as the bellybutton, groins, or old incisions.  Ice packs or heating pads (30-60 minutes up to 6 times a day) will help. Use ice for the first few days to help decrease swelling and bruising, then switch to heat to help relax tight/sore spots and speed recovery.  Some people prefer to use ice alone, heat alone, alternating between ice & heat.  Experiment to what works for you.  Swelling and bruising can take several weeks to resolve.   It is helpful to take an over-the-counter pain medication regularly for the first days: Naproxen (Aleve, etc)  Two 220mg  tabs twice a  day OR Ibuprofen (Advil, etc) Three 200mg  tabs four times a day (every meal & bedtime) AND Acetaminophen  (Tylenol , etc) 325-650mg  four times a day (every meal & bedtime) A  prescription for pain medication should be given to you upon discharge.  Take your pain medication as prescribed, IF NEEDED.  If you are having problems/concerns with the prescription medicine (does not control pain, nausea, vomiting, rash, itching, etc), please call us  [phone] to see if we need to switch you to a different pain medicine that will work better for you and/or control your side effect better. If you need a refill on your pain medication, please contact your pharmacy.  They will contact our office to request authorization. Prescriptions will not be filled after 5 pm or on week-ends.  Avoid getting constipated.  Between the surgery and the pain medications, it is common to experience some constipation.  Increasing fluid intake and taking a fiber supplement (such as Metamucil, Citrucel, FiberCon, MiraLax, etc) 1-2 times a day regularly will usually help prevent this problem from occurring.  A mild laxative (prune juice, Milk of Magnesia, MiraLax, etc) should be taken according to package directions if there are no bowel movements after 48 hours.    Wash / shower every day, starting 2 days after surgery.  You may shower over the skin glue which is waterproof.  No rubbing, scrubbing, lotions or ointments to incision(s). Do not soak or submerge incisions.   Glue will flake off after about 2 weeks.  You may leave the incisions open to air.  You may replace a dressing/Band-Aid to cover an incision for comfort if you wish.  Continue to shower over incision(s) after the dressing is off.  ACTIVITIES as tolerated:   You may resume regular (light) daily activities beginning the next day--such as daily self-care, walking, climbing stairs--gradually increasing activities as tolerated.  Control your pain so that you can walk an  hour a day.  If you can walk 30 minutes without difficulty, it is safe to try more intense activity such as jogging, treadmill, bicycling, low-impact aerobics, swimming, etc. Refrain from the most intensive and strenuous activity such as sit-ups, heavy lifting, contact sports, etc  Refrain from any heavy lifting or straining (more than 20lb) until 6 weeks after surgery.   DO NOT PUSH THROUGH PAIN.  Let pain be your guide: If it hurts to do something, don't do it.  Pain is your body warning you to avoid that activity for another week until the pain goes down. You may drive when you are no longer taking prescription pain medication, you can comfortably wear a seatbelt, and you can safely maneuver your car and apply brakes. You may have sexual intercourse when it is comfortable.   FOLLOW UP in our office Please call [facility] at [phone] to set up an appointment to see your surgeon in the office for a follow-up appointment approximately 2-3 weeks after your surgery. Make sure that you call for this appointment the day you arrive home to insure a convenient appointment time.  9.  If you have disability of FMLA / Family leave forms, please bring the forms to the office for processing.  (do not give to your surgeon).  WHEN TO CALL US  [phone]: Poor pain control Reactions / problems with new medications (rash/itching, nausea, etc)  Fever over 101.5 F (38.5 C) Inability to urinate Nausea and/or vomiting Worsening swelling or bruising Continued bleeding from incision. Increased pain, redness, or drainage from the incision   The clinic staff is available to answer your questions during regular business hours (8:30am-5pm).  Please don't hesitate to call and ask to speak to one of our nurses for clinical concerns.   If you have a medical emergency, go to the nearest emergency room or call 911.  A surgeon from [facility] is always on call at the hospitals in [facility], [place] [address], [address], [place], [place]  [address] ?  [address] [address], [place], [place]   [address] MAIN: [phone] ? TOLL FREE: [phone] ? FAX: [phone] [email or web address]

## 2024-02-24 NOTE — Anesthesia Procedure Notes (Signed)
 Procedure Name: Intubation Date/Time: 02/24/2024 1:53 PM  Performed by: Augusta Daved SAILOR, CRNAPre-anesthesia Checklist: Patient identified, Emergency Drugs available, Suction available and Patient being monitored Patient Re-evaluated:Patient Re-evaluated prior to induction Oxygen Delivery Method: Circle System Utilized Preoxygenation: Pre-oxygenation with 100% oxygen Induction Type: IV induction Ventilation: Mask ventilation without difficulty Laryngoscope Size: Glidescope and 3 Grade View: Grade I Tube type: Oral Tube size: 7.0 mm Number of attempts: 1 Airway Equipment and Method: Stylet and Oral airway Placement Confirmation: ETT inserted through vocal cords under direct vision, positive ETCO2 and breath sounds checked- equal and bilateral Secured at: 22 (at the lip) cm Tube secured with: Tape Dental Injury: Teeth and Oropharynx as per pre-operative assessment

## 2024-02-24 NOTE — Anesthesia Preprocedure Evaluation (Signed)
 Anesthesia Evaluation  Patient identified by MRN, date of birth, ID band Patient awake    Reviewed: Allergy & Precautions, NPO status , Patient's Chart, lab work & pertinent test results, reviewed documented beta blocker date and time   History of Anesthesia Complications (+) PONV and history of anesthetic complications  Airway Mallampati: II  TM Distance: >3 FB Neck ROM: Full    Dental no notable dental hx.    Pulmonary neg COPD   breath sounds clear to auscultation       Cardiovascular (-) angina (-) CAD, (-) Past MI, (-) Cardiac Stents and (-) CABG  Rhythm:Regular Rate:Normal     Neuro/Psych neg Seizures  Anxiety        GI/Hepatic ,neg GERD  ,,(+) neg Cirrhosis        Endo/Other    Renal/GU Renal disease     Musculoskeletal  (+) Arthritis ,    Abdominal   Peds  Hematology   Anesthesia Other Findings   Reproductive/Obstetrics                              Anesthesia Physical Anesthesia Plan  ASA: 2  Anesthesia Plan: General   Post-op Pain Management:    Induction: Intravenous  PONV Risk Score and Plan: 2 and Ondansetron , Dexamethasone  and Propofol  infusion  Airway Management Planned: Oral ETT  Additional Equipment:   Intra-op Plan:   Post-operative Plan: Extubation in OR  Informed Consent: I have reviewed the patients History and Physical, chart, labs and discussed the procedure including the risks, benefits and alternatives for the proposed anesthesia with the patient or authorized representative who has indicated his/her understanding and acceptance.     Dental advisory given  Plan Discussed with:   Anesthesia Plan Comments:         Anesthesia Quick Evaluation

## 2024-02-24 NOTE — Op Note (Signed)
 Operative Note  Marilyn Leonard  [id number]  [id number]  02/24/2024   Surgeon: Mitzie Freund MD FACS   Procedure performed: Robotic (transabdominal preperitoneal) left femoral hernia repair   Preop diagnosis: Left inguinal hernia Post-op diagnosis/intraop findings: Left femoral hernia containing chronically incarcerated preperitoneal fat; adhesions of the sigmoid colon to the peritoneum along the left anterior abdominal wall and to the hernia sac   Specimens: no Retained items: no  EBL: Minimal cc Complications: none   Description of procedure: After confirming informed consent the patient was taken to the operating room and placed supine on operating room table where general endotracheal anesthesia was initiated, preoperative antibiotics were administered, SCDs applied, foley inserted and a formal timeout was performed. The abdomen was prepped and draped in usual sterile fashion. Peritoneal access was gained using a left subcostal Veress needle and insufflation to 15 mmHg ensued without incident. 8mm periumbilical trocar and camera were inserted. The abdomen was inspected and confirmed to be free of any injury from our entry or gross abnormalities. The patient was then placed in Trendelenburg and the pelvis inspected.  There is no hernia on the right side.  On the left side, adhesions of the sigmoid colon to the anterior abdominal wall somewhat obscure visualization but there does appear to be a hernia defect.  Bilateral laparoscopic assisted taps blocks were performed with Exparel  mixed with 0.25% Marcaine  with epinephrine .  Under direct visualization, bilateral 8 mm robotic trocars were placed.  The robot was then docked and instruments inserted under direct visualization.  A careful lysis of adhesions ensued to free the sigmoid colon and its epiploic appendages from adhesions to the anterior abdominal wall heading towards the hernia sac.  Hemostasis was achieved with a combination of cautery and  bipolar energy along the epiploic appendages that did tear somewhat during lysis of adhesions however no energy was used directly adjacent to the colon serosa.  Once the colon was able to drop down, we were able to visualize what appeared to be a femoral hernia defect.   The peritoneal flap was developed using sharp and cautery dissection spanning from the anterior superior iliac spine to the medial umbilical ligament. The flap was bluntly dissected off the anterior abdominal wall first developing the lateral compartment.  The space of Retzius was then bluntly entered and carefully dissected, exposing Cooper's ligament and about 2 cm inferior to this and allowing the bladder to follow a, creating space for the mesh medially and inferiorly.  The round ligament was skeletonized, cauterized with bipolar energy and then divided, ensuring hemostasis.  The peritoneal flap dissection continued inferiorly, exposing the iliac vessels leaving only the chronically incarcerated preperitoneal fat within the femoral hernia defect left to dissect.  This was somewhat adherent and difficult to reduce, but ultimately we were able to reduce this tissue entirely from the femoral hernia defect.  This appeared to be preperitoneal fat tissue from the medial umbilical ligament.  Hemostasis was confirmed in the dissection field.  Ultimately there was excellent exposure of the myopectineal orifice.  A Bard 3D max mid weight large left-sided mesh was then placed over the hernia defect where there was noted to be excellent overlap of the femoral hernia defect as well as the direct and indirect spaces. The mesh was secured to the Cooper's ligament and superiorly on either side of the inferior epigastric vessels using simple interrupted 3-0 Vicryl sutures. The peritoneal flap was then brought back up to completely cover the mesh, ensuring that the mesh remained  flush against the abdominal wall and did not fold or buckle while doing so.  The  peritoneum was closed with a running imbricating 3-0 V-Loc suture. At completion there was no exposed mesh present. Hemostasis was again confirmed.  The colon was reinspected and confirmed to be free of any injury.  The abdominal cavity was once again surveyed and there were no abnormalities or injuries present.  All needles and robotic instruments were removed under direct visualization.   The robot was then undocked, the abdomen desufflated, and trocars were removed.  The skin incisions were closed with subcuticular 4-0 Monocryl and Dermabond. The patient was then awakened, extubated and taken to PACU in stable condition.  All counts were correct at the completion of the case.

## 2024-02-24 NOTE — Transfer of Care (Signed)
 Immediate Anesthesia Transfer of Care Note  Patient: Marilyn Leonard  Procedure(s) Performed: REPAIR, HERNIA, FEMORAL, LEFT, ROBOT-ASSISTED (Left)  Patient Location: PACU  Anesthesia Type:General  Level of Consciousness: awake and patient cooperative  Airway & Oxygen Therapy: Patient Spontanous Breathing and Patient connected to face mask  Post-op Assessment: Report given to RN and Post -op Vital signs reviewed and stable  Post vital signs: Reviewed and stable  Last Vitals:  Vitals Value Taken Time  BP    Temp    Pulse 72 02/24/24 15:55  Resp 12 02/24/24 15:55  SpO2 100 % 02/24/24 15:55  Vitals shown include unfiled device data.  Last Pain:  Vitals:   02/24/24 1109  TempSrc: Oral         Complications: No notable events documented.

## 2024-02-26 NOTE — Anesthesia Postprocedure Evaluation (Signed)
 Anesthesia Post Note  Patient: Marilyn Leonard  Procedure(s) Performed: REPAIR, HERNIA, FEMORAL, LEFT, ROBOT-ASSISTED (Left)     Patient location during evaluation: PACU Anesthesia Type: General Level of consciousness: awake and alert Pain management: pain level controlled Vital Signs Assessment: post-procedure vital signs reviewed and stable Respiratory status: spontaneous breathing, nonlabored ventilation, respiratory function stable and patient connected to nasal cannula oxygen Cardiovascular status: blood pressure returned to baseline and stable Postop Assessment: no apparent nausea or vomiting Anesthetic complications: no   No notable events documented.  Last Vitals:  Vitals:   02/24/24 1644 02/24/24 1646  BP: (!) 145/80 (!) 145/85  Pulse: 72 78  Resp: 20   Temp: (!) 36.4 C   SpO2: 99% 99%    Last Pain:  Vitals:   02/24/24 1644  TempSrc: Temporal  PainSc: 0-No pain                 [name]

## 2024-02-27 ENCOUNTER — Encounter (HOSPITAL_COMMUNITY): Payer: Self-pay | Admitting: Surgery

## 2024-07-24 ENCOUNTER — Encounter: Payer: Self-pay | Admitting: Obstetrics and Gynecology

## 2024-07-24 ENCOUNTER — Other Ambulatory Visit: Payer: Self-pay | Admitting: Obstetrics and Gynecology

## 2024-07-24 DIAGNOSIS — N6342 Unspecified lump in left breast, subareolar: Secondary | ICD-10-CM

## 2024-08-08 ENCOUNTER — Ambulatory Visit
Admission: RE | Admit: 2024-08-08 | Discharge: 2024-08-08 | Disposition: A | Source: Ambulatory Visit | Attending: Obstetrics and Gynecology | Admitting: Obstetrics and Gynecology

## 2024-08-08 DIAGNOSIS — N6342 Unspecified lump in left breast, subareolar: Secondary | ICD-10-CM
